# Patient Record
Sex: Male | Born: 1970 | Race: Asian | Hispanic: Yes | Marital: Married | Smoking: Never smoker
Health system: Southern US, Community
[De-identification: ages and names within clinical notes are randomized; demographics above are authoritative.]

## PROBLEM LIST (undated history)

## (undated) DIAGNOSIS — E785 Hyperlipidemia, unspecified: Secondary | ICD-10-CM

---

## 2016-01-17 ENCOUNTER — Emergency Department (HOSPITAL_COMMUNITY): Payer: No Typology Code available for payment source

## 2016-01-17 ENCOUNTER — Emergency Department (HOSPITAL_COMMUNITY)
Admission: EM | Admit: 2016-01-17 | Discharge: 2016-01-17 | Disposition: A | Discharge status: Home / Self Care | Payer: No Typology Code available for payment source | Attending: Emergency Medicine | Admitting: Emergency Medicine

## 2016-01-17 ENCOUNTER — Encounter (HOSPITAL_COMMUNITY): Payer: Self-pay | Admitting: Emergency Medicine

## 2016-01-17 DIAGNOSIS — R9431 Abnormal electrocardiogram [ECG] [EKG]: Secondary | ICD-10-CM | POA: Diagnosis not present

## 2016-01-17 DIAGNOSIS — R079 Chest pain, unspecified: Secondary | ICD-10-CM | POA: Diagnosis not present

## 2016-01-17 DIAGNOSIS — R0789 Other chest pain: Secondary | ICD-10-CM | POA: Diagnosis present

## 2016-01-17 DIAGNOSIS — K7689 Other specified diseases of liver: Secondary | ICD-10-CM

## 2016-01-17 HISTORY — DX: Hyperlipidemia, unspecified: E78.5

## 2016-01-17 LAB — BASIC METABOLIC PANEL
ANION GAP: 7 (ref 5–15)
BUN: 15 mg/dL (ref 6–20)
CO2: 26 mmol/L (ref 22–32)
Calcium: 9.2 mg/dL (ref 8.9–10.3)
Chloride: 105 mmol/L (ref 101–111)
Creatinine, Ser: 1.03 mg/dL (ref 0.61–1.24)
GFR calc Af Amer: >60 mL/min (ref >60)
GLUCOSE: 112 mg/dL — AB (ref 65–99)
Potassium: 4 mmol/L (ref 3.5–5.1)
SODIUM: 138 mmol/L (ref 135–145)

## 2016-01-17 LAB — CBC
HCT: 45.1 % (ref 39.0–52.0)
HEMOGLOBIN: 16 g/dL (ref 13.0–17.0)
MCH: 32.4 pg (ref 26.0–34.0)
MCHC: 35.5 g/dL (ref 30.0–36.0)
MCV: 91.3 fL (ref 78.0–100.0)
Platelets: 172 10*3/uL (ref 150–400)
RBC: 4.94 MIL/uL (ref 4.22–5.81)
RDW: 12.4 % (ref 11.5–15.5)
WBC: 5.6 10*3/uL (ref 4.0–10.5)

## 2016-01-17 LAB — I-STAT TROPONIN, ED
TROPONIN I, POC: 0 ng/mL (ref 0.00–0.08)
TROPONIN I, POC: 0 ng/mL (ref 0.00–0.08)

## 2016-01-17 LAB — HEPATIC FUNCTION PANEL
ALT: 19 U/L (ref 17–63)
AST: 22 U/L (ref 15–41)
Albumin: 4 g/dL (ref 3.5–5.0)
Alkaline Phosphatase: 64 U/L (ref 38–126)
BILIRUBIN DIRECT: 0.1 mg/dL (ref 0.1–0.5)
BILIRUBIN INDIRECT: 1 mg/dL — AB (ref 0.3–0.9)
TOTAL PROTEIN: 6.4 g/dL — AB (ref 6.5–8.1)
Total Bilirubin: 1.1 mg/dL (ref 0.3–1.2)

## 2016-01-17 LAB — SEDIMENTATION RATE: Sed Rate: 1 mm/hr (ref 0–16)

## 2016-01-17 LAB — LIPASE, BLOOD: Lipase: 24 U/L (ref 11–51)

## 2016-01-17 MED ORDER — COLCHICINE 0.6 MG PO TABS: 0.6000 mg | ORAL_TABLET | Freq: Two times a day (BID) | ORAL | 0 refills | Status: DC

## 2016-01-17 MED ORDER — NITROGLYCERIN 0.4 MG SL SUBL
0.4000 mg | SUBLINGUAL_TABLET | SUBLINGUAL | Status: DC | PRN
  Administered 2016-01-17: 0.4 mg via SUBLINGUAL
  Filled 2016-01-17: qty 1

## 2016-01-17 MED ORDER — IBUPROFEN 800 MG PO TABS: 800.0000 mg | ORAL_TABLET | Freq: Three times a day (TID) | ORAL | 0 refills | Status: AC

## 2016-01-17 MED ORDER — ASPIRIN 81 MG PO CHEW
324.0000 mg | CHEWABLE_TABLET | Freq: Once | ORAL | Status: AC
  Administered 2016-01-17: 324 mg via ORAL
  Filled 2016-01-17: qty 4

## 2016-01-17 MED ORDER — IOPAMIDOL (ISOVUE-370) INJECTION 76%
INTRAVENOUS | Status: AC
  Administered 2016-01-17: 80 mL
  Filled 2016-01-17: qty 100

## 2016-01-17 MED ORDER — PANTOPRAZOLE SODIUM 40 MG PO TBEC
40.0000 mg | DELAYED_RELEASE_TABLET | Freq: Once | ORAL | Status: AC
  Administered 2016-01-17: 40 mg via ORAL
  Filled 2016-01-17: qty 1

## 2016-01-17 NOTE — ED Notes (Signed)
Patient transported to X-ray 

## 2016-01-17 NOTE — ED Provider Notes (Signed)
[facility]EMERGENCY DEPT Provider Note   CSN: [id number] Arrival date & time: 01/17/16  [id number]     History   Chief Complaint Chief Complaint  Patient presents with  . Chest Pain    HPI Luis Simmons is a 45 y.o. male.  HPI Patient was awakened from sleep by chest pain approximately 45 minutes prior to arrival. He reports it was like a heavy and painful pressure in the center of his chest. He states that the time he also had a little bit of a sweat and nausea. He denies shortness of breath. Pain resolved just shortly after arriving to the emergency department. He reports at this time his pain is gone. He did not take anything to try to treat it. He denies history of similar pain. Patient denies pain, fatigue or dyspnea with usual activities. No recent long travel or immobilization. Patient does not smoke. No cardiac history. He reports borderline hypercholesterolemia.  Family history: Patient's father had bypass surgery at age 46. History reviewed. No pertinent past medical history.  There are no active problems to display for this patient.   History reviewed. No pertinent surgical history.     Home Medications    Prior to Admission medications   Not on File    Family History History reviewed. No pertinent family history.  Social History Social History  Substance Use Topics  . Smoking status: Never Smoker  . Smokeless tobacco: Never Used  . Alcohol use No     Allergies   Review of patient's allergies indicates no known allergies.   Review of Systems Review of Systems  10 Systems reviewed and are negative for acute change except as noted in the HPI.  Physical Exam Updated Vital Signs BP 115/76   Pulse 61   Temp 97.7 F (36.5 C) (Oral)   Resp 16   SpO2 98%   Physical Exam  Constitutional: He appears well-developed and well-nourished.  HENT:  Head: Normocephalic and atraumatic.  Nose: Nose normal.  Mouth/Throat: Oropharynx is clear and moist.  Eyes:  Conjunctivae and EOM are normal. Pupils are equal, round, and reactive to light.  Neck: Neck supple.  Cardiovascular: Normal rate and regular rhythm.   No murmur heard. Pulmonary/Chest: Effort normal and breath sounds normal. No respiratory distress. He exhibits no tenderness.  Abdominal: Soft. There is no tenderness.  Musculoskeletal: He exhibits no edema, tenderness or deformity.  Neurological: He is alert.  Skin: Skin is warm and dry.  Psychiatric: He has a normal mood and affect.  Nursing note and vitals reviewed.    ED Treatments / Results  Labs (all labs ordered are listed, but only abnormal results are displayed) Labs Reviewed  BASIC METABOLIC PANEL - Abnormal; Notable for the following:       Result Value   Glucose, Bld 112 (*)    All other components within normal limits  HEPATIC FUNCTION PANEL - Abnormal; Notable for the following:    Total Protein 6.4 (*)    Indirect Bilirubin 1.0 (*)    All other components within normal limits  CBC  LIPASE, BLOOD  I-STAT TROPOININ, ED    EKG  EKG Interpretation  Date/Time:  Friday January 17 2016 05:33:17 EDT Ventricular Rate:  55 PR Interval:    QRS Duration: 78 QT Interval:  428 QTC Calculation: 410 R Axis:   60 Text Interpretation:  Sinus rhythm RSR' in V1 or V2, probably normal variant ST elevation suggests acute pericarditis agree. no old comparison. Confirmed by Donnald GarrePfeiffer, MD, Lebron ConnersMarcy (  [id number]) on 01/17/2016 5:39:43 AM Also confirmed by Donnald Garre, MD, Lebron Conners [phone]), editor [place]  CCT, [place] ([id number])  on 01/17/2016 6:59:54 AM       Radiology Dg Chest 2 View  Result Date: 01/17/2016 CLINICAL DATA:  Centralized chest pain this morning, now improving. EXAM: CHEST  2 VIEW COMPARISON:  None. FINDINGS: Cardiomediastinal silhouette is normal. No pleural effusions or focal consolidations. Trachea projects midline and there is no pneumothorax. Soft tissue planes and included osseous structures are non-suspicious. IMPRESSION:  Normal chest. Electronically Signed   By: Awilda Metro M.D.   On: 01/17/2016 06:24    Procedures Procedures (including critical care time)  Medications Ordered in ED Medications  aspirin chewable tablet 324 mg (324 mg Oral Given 01/17/16 0551)  pantoprazole (PROTONIX) EC tablet 40 mg (40 mg Oral Given 01/17/16 0608)     Initial Impression / Assessment and Plan / ED Course  I have reviewed the triage vital signs and the nursing notes.  Pertinent labs & imaging results that were available during my care of the patient were reviewed by me and considered in my medical decision making (see chart for details).  Clinical Course   Consult: Dr. Rennis Golden cardiology will consult in ER.  Final Clinical Impressions(s) / ED Diagnoses   Final diagnoses:  Chest pain, unspecified chest pain type   Definitive plan per cardiology consult. New Prescriptions New Prescriptions   No medications on file     Arby Barrette, MD 01/26/16 [id number]

## 2016-01-17 NOTE — Consult Note (Signed)
CONSULTATION NOTE  Reason for Consult: Chest pain  Requesting Physician: Dr. Johnney Killian  Cardiologist: None (NEW)  HPI: This is a 45 y.o. male with no significant past medical history, who presents with chest pain that awakened him from sleep approximate 45 minutes prior to arrival to the hospital. He reported heaviness and central pressure in his chest. He was nauseated and somewhat diaphoretic. He reports the pain resolved just after arriving to the emergency department. Family history significant for coronary artery disease in his father who had bypass surgery at age 11 however he has no significant personal risk factors. He denies tobacco use, hypertension, diabetes and has had borderline diet-controlled dyslipidemia in the past. He says the pain is somewhat worse while laying down and a little bit improved when sitting up but denies any fevers or chills and has a normal white blood cell count. He has not had any recent sick contacts nor any recent viral illness.  PMHx:  Past Medical History:  Diagnosis Date  . Diet-controlled hyperlipidemia    History reviewed. No pertinent surgical history.  FAMHx: Family History  Problem Relation Age of Onset  . CAD Father     Bypass at age 38    SOCHx:  reports that he has never smoked. He has never used smokeless tobacco. He reports that he does not drink alcohol. His drug history is not on file.  ALLERGIES: No Known Allergies  ROS: Pertinent items noted in HPI and remainder of comprehensive ROS otherwise negative.  HOME MEDICATIONS: No current facility-administered medications on file prior to encounter.    No current outpatient prescriptions on file prior to encounter.    HOSPITAL MEDICATIONS: I have reviewed the patient's current medications.  VITALS: Blood pressure 115/76, pulse 61, temperature 97.7 F (36.5 C), temperature source Oral, resp. rate 16, SpO2 98 %.  PHYSICAL EXAM: General appearance: alert and no  distress Neck: no carotid bruit and no JVD Lungs: clear to auscultation bilaterally Heart: regular rate and rhythm Abdomen: soft, non-tender; bowel sounds normal; no masses,  no organomegaly Extremities: extremities normal, atraumatic, no cyanosis or edema Pulses: 2+ and symmetric Skin: Skin color, texture, turgor normal. No rashes or lesions Neurologic: Grossly normal Psych: Pleasant  LABS: Results for orders placed or performed during the hospital encounter of 01/17/16 (from the past 48 hour(s))  Basic metabolic panel     Status: Abnormal   Collection Time: 01/17/16  5:45 AM  Result Value Ref Range   Sodium 138 135 - 145 mmol/L   Potassium 4.0 3.5 - 5.1 mmol/L   Chloride 105 101 - 111 mmol/L   CO2 26 22 - 32 mmol/L   Glucose, Bld 112 (H) 65 - 99 mg/dL   BUN 15 6 - 20 mg/dL   Creatinine, Ser 1.03 0.61 - 1.24 mg/dL   Calcium 9.2 8.9 - 10.3 mg/dL   GFR calc non Af Amer >60 >60 mL/min   GFR calc Af Amer >60 >60 mL/min    Comment: (NOTE) The eGFR has been calculated using the CKD EPI equation. This calculation has not been validated in all clinical situations. eGFR's persistently <60 mL/min signify possible Chronic Kidney Disease.    Anion gap 7 5 - 15  CBC     Status: None   Collection Time: 01/17/16  5:45 AM  Result Value Ref Range   WBC 5.6 4.0 - 10.5 K/uL   RBC 4.94 4.22 - 5.81 MIL/uL   Hemoglobin 16.0 13.0 - 17.0 g/dL   HCT  45.1 39.0 - 52.0 %   MCV 91.3 78.0 - 100.0 fL   MCH 32.4 26.0 - 34.0 pg   MCHC 35.5 30.0 - 36.0 g/dL   RDW 12.4 11.5 - 15.5 %   Platelets 172 150 - 400 K/uL  Hepatic function panel     Status: Abnormal   Collection Time: 01/17/16  5:45 AM  Result Value Ref Range   Total Protein 6.4 (L) 6.5 - 8.1 g/dL   Albumin 4.0 3.5 - 5.0 g/dL   AST 22 15 - 41 U/L   ALT 19 17 - 63 U/L   Alkaline Phosphatase 64 38 - 126 U/L   Total Bilirubin 1.1 0.3 - 1.2 mg/dL   Bilirubin, Direct 0.1 0.1 - 0.5 mg/dL   Indirect Bilirubin 1.0 (H) 0.3 - 0.9 mg/dL  Lipase,  blood     Status: None   Collection Time: 01/17/16  5:45 AM  Result Value Ref Range   Lipase 24 11 - 51 U/L  I-stat troponin, ED     Status: None   Collection Time: 01/17/16  5:49 AM  Result Value Ref Range   Troponin i, poc 0.00 0.00 - 0.08 ng/mL   Comment 3            Comment: Due to the release kinetics of cTnI, a negative result within the first hours of the onset of symptoms does not rule out myocardial infarction with certainty. If myocardial infarction is still suspected, repeat the test at appropriate intervals.     IMAGING: Dg Chest 2 View  Result Date: 01/17/2016 CLINICAL DATA:  Centralized chest pain this morning, now improving. EXAM: CHEST  2 VIEW COMPARISON:  None. FINDINGS: Cardiomediastinal silhouette is normal. No pleural effusions or focal consolidations. Trachea projects midline and there is no pneumothorax. Soft tissue planes and included osseous structures are non-suspicious. IMPRESSION: Normal chest. Electronically Signed   By: Elon Alas M.D.   On: 01/17/2016 06:24   EKG: Normal sinus rhythm with less than 1 mm ST elevation at the J point diffusely in the inferior and anterolateral leads, consider pericarditis or early repolarization.  HOSPITAL DIAGNOSES: Active Problems:   Chest pain, unspecified   Abnormal finding on EKG   IMPRESSION: 1. Acute precordial chest pain, possibly consistent with unstable angina or pericarditis  RECOMMENDATION: 1. Mr. Agard presents with acute onset chest pain that woke him from sleep. He reported it as a central heaviness associated with some nausea and diaphoresis. The symptoms lasted for about 30-45 minutes and then resolve spontaneously. He does not have a history of reflux or any chest pain similar to this. His family history is compelling for coronary disease but he has few personal risk factors. Laboratory work is unremarkable. EKG is concerning for either pericarditis or early repolarization. He does have some  positional change in his symptoms however does not have an elevated white blood cell count, fever or recent viral illness which could suggest pericarditis. I would recommend a second troponin. If this number is negative, then would proceed with a CT coronary angiogram to determine if there is any significant obstructive coronary disease and evaluate his pericardium. Will also add an ESR to his labs to hopefully rule out a pericarditis. Please keep NPO - if his CT angiogram is abnormal, we will admit him and he will likely need LHC later today.  Thanks for the consultation.  Time Spent Directly with Patient: 45 minutes  Pixie Casino, MD, [facility] Attending Cardiologist [facility]  Chrissie Noa  C Jadaya Sommerfield 01/17/2016, 8:23 AM

## 2016-01-17 NOTE — ED Notes (Signed)
Pt is dressed and ready for discharge at this time.

## 2016-01-17 NOTE — ED Triage Notes (Signed)
Pt presents from home with RIGHT sided CP that began approx 45 min PTA which awoke pt from sleep; pt states the pain is heavy and intermittent; denies hx of cardiac illness; pt also reports cold chills PTA;

## 2016-01-17 NOTE — ED Provider Notes (Addendum)
Care assumed from Dr. Donnald GarrePfeiffer at 0730 with plan for f/u cardiology consultation.   Results:  BP 115/76   Pulse 61   Temp 97.7 F (36.5 C) (Oral)   Resp 16   SpO2 98%   Results for orders placed or performed during the hospital encounter of 01/17/16  Basic metabolic panel  Result Value Ref Range   Sodium 138 135 - 145 mmol/L   Potassium 4.0 3.5 - 5.1 mmol/L   Chloride 105 101 - 111 mmol/L   CO2 26 22 - 32 mmol/L   Glucose, Bld 112 (H) 65 - 99 mg/dL   BUN 15 6 - 20 mg/dL   Creatinine, Ser 1.611.03 0.61 - 1.24 mg/dL   Calcium 9.2 8.9 - 09.610.3 mg/dL   GFR calc non Af Amer >60 >60 mL/min   GFR calc Af Amer >60 >60 mL/min   Anion gap 7 5 - 15  CBC  Result Value Ref Range   WBC 5.6 4.0 - 10.5 K/uL   RBC 4.94 4.22 - 5.81 MIL/uL   Hemoglobin 16.0 13.0 - 17.0 g/dL   HCT 04.545.1 40.939.0 - 81.152.0 %   MCV 91.3 78.0 - 100.0 fL   MCH 32.4 26.0 - 34.0 pg   MCHC 35.5 30.0 - 36.0 g/dL   RDW 91.412.4 78.211.5 - 95.615.5 %   Platelets 172 150 - 400 K/uL  Hepatic function panel  Result Value Ref Range   Total Protein 6.4 (L) 6.5 - 8.1 g/dL   Albumin 4.0 3.5 - 5.0 g/dL   AST 22 15 - 41 U/L   ALT 19 17 - 63 U/L   Alkaline Phosphatase 64 38 - 126 U/L   Total Bilirubin 1.1 0.3 - 1.2 mg/dL   Bilirubin, Direct 0.1 0.1 - 0.5 mg/dL   Indirect Bilirubin 1.0 (H) 0.3 - 0.9 mg/dL  Lipase, blood  Result Value Ref Range   Lipase 24 11 - 51 U/L  Sedimentation rate  Result Value Ref Range   Sed Rate 1 0 - 16 mm/hr  I-stat troponin, ED  Result Value Ref Range   Troponin i, poc 0.00 0.00 - 0.08 ng/mL   Comment 3          [facility] T[facility]onin[facility])  Result Value Ref Range   Troponin i, poc 0.00 0.00 - 0.08 ng/mL   Comment 3            Dg Chest 2 View  Result Date: 01/17/2016 CLINICAL DATA:  Centralized chest pain this morning, now improving. EXAM: CHEST  2 VIEW COMPARISON:  None. FINDINGS: Cardiomediastinal silhouette is normal. No pleural effusions or focal consolidations. Trachea projects midline and there is  no pneumothorax. Soft tissue planes and included osseous structures are non-suspicious. IMPRESSION: Normal chest. Electronically Signed   By: Awilda Metroourtnay  Bloomer M.D.   On: 01/17/2016 06:24   Ct Coronary Morph W/cta Cor W/score W/ca W/cm &/or Wo/cm  Addendum Date: 01/17/2016   ADDENDUM REPORT: 01/17/2016 10:43 CLINICAL DATA:  45 year old male who presented to the ER with an acute chest pain. EXAM: Cardiac/Coronary  CT TECHNIQUE: The patient was scanned on a Philips 256 scanner. FINDINGS: A 120 kV prospective scan was triggered in the descending thoracic aorta at 111 HU's. Axial non-contrast 3 mm slices were carried out through the heart. The data set was analyzed on a dedicated work station and scored using the Agatson method. Gantry rotation speed was 270 msecs and collimation was .9 mm. No beta blockade and 0.4 mg of  sl NTG was given. The 3D data set was reconstructed in 5% intervals of the 67-82 % of the R-R cycle. Diastolic phases were analyzed on a dedicated work station using MPR, MIP and VRT modes. The patient received 80 cc of contrast. Aorta:  Normal size.  No calcifications.  No dissection. Aortic Valve:  Trileaflet.  No calcifications. Coronary Arteries:  Normal coronary origin.  Right dominance. Left main is a large artery with no plaque. LAD is a large artery that wraps around the apex and gives rise to one diagonal branch. There is no plaque. LCX artery is a medium size non-dominant vessel that gives rise to one OM branch. There is no plaque. RCA is a large dominant artery that gives rise to PDA and PLVB, there is no plaque. IMPRESSION: 1. Coronary calcium score of 0. This was 0 percentile for age and sex matched control. 2. Normal coronary origin.  Right dominance. 3. The study is affected by misregistration artifact, however there is no evidence of CAD. 4. There is trivial amount of pericardial effusion posterior to the heart. Pericardium is not thickened. Tobias Alexander Electronically Signed    By: Tobias Alexander   On: 01/17/2016 10:43   Result Date: 01/17/2016 EXAM: OVER-READ INTERPRETATION  CT CHEST The following report is an over-read performed by radiologist Dr. Nadara Eaton [facility] on 01/17/2016. This over-read does not include interpretation of cardiac or coronary anatomy or pathology. The coronary calcium score/coronary CTA interpretation by the cardiologist is attached. COMPARISON:  Chest radiographs 01/17/2016 FINDINGS: 6 mm calcified pleural-based nodule in the left lower lobe superior segment. Few densities along the dependent aspect of the right lower lobe are probably related to atelectasis. Normal caliber of the visualized thoracic aorta without dissection. There is an enhancing lesion in the anterior left hepatic lobe measuring up to 4.3 cm. No significant pericardial or pleural fluid. Limited evaluation of the central pulmonary arteries due to motion artifact and poor contrast opacification. No significant chest lymphadenopathy. No acute bone abnormality. IMPRESSION: 4.3 cm enhancing lesion in the left hepatic lobe. Lesion is nonspecific on this arterial phase of imaging. Recommend more definitive characterization of this liver lesion with a liver MRI, with and without contrast. These results will be called to the ordering clinician or representative by the Radiologist Assistant, and communication documented in the PACS or zVision Dashboard. Electronically Signed: By: Richarda Overlie M.D. On: 01/17/2016 10:20    Radiology and laboratory examinations were reviewed by me and used in medical decision making if performed.   MDM:  Cardiology recommending repeat troponin and CT coronary angiography for further risk stratification.  Incidental lever lesion noted and will require outpatient f/u with non-emergent MRI which was communicated to patient.   CT coronaries is low risk with calcium score 0. Cardiology has cleared for dc. Appreciate input from Dr Rennis Golden. Pt to be  treated empirically for pericarditis and will f/u with cardiology as instructed.  Plan to follow up with PCP as needed for recheck/imaging and return precautions discussed for worsening or new concerning symptoms.   Diagnoses that have been ruled out:  None  Diagnoses that are still under consideration:  None  Final diagnoses:  Chest pain, unspecified chest pain type  Chest pain  Liver nodule      Lyndal Pulley, MD 01/17/16 1055    Lyndal Pulley, MD 01/17/16 1114

## 2016-01-17 NOTE — Progress Notes (Signed)
Office scheduling line busy. Dr. Rennis GoldenHilty requests 1 month follow-up with him. [facility] requesting a follow-up appointment, and our office will call the patient with this information. Of note he also spoke with the patient about his liver abnormality on MRI and also recommended f/u with PCP for this.  Dayna Dunn PA-C

## 2016-01-17 NOTE — ED Notes (Signed)
Pt returned from CT scan-- coke given-- pt denies any pain.

## 2016-01-17 NOTE — Discharge Instructions (Addendum)
YOU HAVE AN INCIDENTAL FINDING OF A NODULE ON YOUR LIVER WHICH WILL REQUIRE FOLLOW UP WITH AN MRI SCAN NON-EMERGENTLY WITH YOUR PRIMARY CARE PHYSICIAN.

## 2016-01-17 NOTE — Progress Notes (Signed)
Reviewed CT angiogram, there is no evidence for significant CAD. Coronary calcium score is zero. Trivial posterior pericardial effusion is noted without pericardial thickening. ESR is 1. Mild diffuse ST elevation could be c/w pericarditis. Recommend treatement with ibuprofen 800 mg TID for 2 weeks and colchicine 0.6 mg BID x 1-3 months. Incidental "enhancing lesion" noted in the liver - could be hemangioma. Dedicated liver MRI recommended - advised patient to follow-up with PCP for evaluation of this. Follow-up with me in the [place] office in 1 month.  Pixie Casino, MD, [facility] Attending Cardiologist [place]

## 2016-03-10 ENCOUNTER — Ambulatory Visit (INDEPENDENT_AMBULATORY_CARE_PROVIDER_SITE_OTHER): Payer: No Typology Code available for payment source | Admitting: Internal Medicine

## 2016-03-10 ENCOUNTER — Encounter: Payer: Self-pay | Admitting: Internal Medicine

## 2016-03-10 VITALS — BP 100/70 | HR 73 | Ht 69.0 in | Wt 171.4 lb

## 2016-03-10 DIAGNOSIS — I300 Acute nonspecific idiopathic pericarditis: Secondary | ICD-10-CM | POA: Diagnosis not present

## 2016-03-10 NOTE — Progress Notes (Signed)
    OFFICE NOTE  Chief Complaint:  No complaints  Primary Care Physician: Luis AstHOLMES, DIONNE NATALIE, MD  HPI:  Luis Simmons is a 45 y.o. male who presents from Hospital follow-up. He was seen in emergency department on 01/17/2016 for chest pain and woken him from sleep. It was described as heaviness and central pressure in the chest with some nausea and diaphoresis. Family history significant for father who had bypass surgery at age 46. He underwent a CT angiogram which showed no evidence of significant coronary disease and 0 coronary artery calcium score. There is a trivial posterior pericardial effusion without pericardial thickening. Erythrocyte sedimentation rate was 1. There were some mild ST segment changes consistent with possible pericarditis seen in multiple leads. I recommended treatment with ibuprofen and colchicine. He completed that medication and notes that he's had no further chest pain symptoms. There was an incidental "enhancing lesion" noted in the liver which could be a hemangioma. 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  PMHx:  Past Medical History:  Diagnosis Date  . Diet-controlled hyperlipidemia     No past surgical history on file.  FAMHx:  Family History  Problem Relation Age of Onset  . CAD Father     Bypass at age 46    SOCHx:   reports that he has never smoked. He has never used smokeless tobacco. He reports that he does not drink alcohol. His drug history is not on file.  ALLERGIES:  No Known Allergies  ROS: Pertinent items noted in HPI and remainder of comprehensive ROS otherwise negative.  HOME MEDS: Current Outpatient Prescriptions on File Prior to Visit  Medication Sig Dispense Refill  . colchicine 0.6 MG tablet Take 1 tablet (0.6 mg total) by mouth 2 (two) times daily. 20 tablet 0   No current facility-administered medications on file prior to visit.     LABS/IMAGING: No  results found for this or any previous visit (from the past 48 hour(s)). No results found.  WEIGHTS: Wt Readings from Last 3 Encounters:  03/10/16 171 lb 6.4 oz (77.7 kg)    VITALS: BP 100/70   Pulse 73   Ht 5\' 9"  (1.753 m)   Wt 171 lb 6.4 oz (77.7 kg)   BMI 25.31 kg/m   EXAM: Deferred  EKG: Deferred  ASSESSMENT: 1. Chest pain-possibly pericarditis, resolved 2. Incidental hemangioma in the liver  PLAN: 1.   [name] had an episode of chest pain which could've possibly been pericarditis with some diffuse ST changes. He completed ibuprofen and colchicine with resolution of his chest pain. Coronary artery CT scan demonstrated no significant coronary disease and no coronary artery calcification. He needs traditional risk factor modification given his family history of coronary disease but follow-up with me can be on an as-needed basis. He should follow-up with his primary care provider.  Chrystie NoseKenneth C. Hilty, MD, [facility] Attending Cardiologist [facility]  Chrystie NoseKenneth C Hilty 03/10/2016, 2:19 PM

## 2016-03-10 NOTE — Patient Instructions (Signed)
Medication Instructions:  Your physician recommends that you continue on your current medications as directed. Please refer to the Current Medication list given to you today.  Labwork: NONE  Testing/Procedures: NONE  Follow-Up: FOLLOW UP AS NEEDED  Any Other Special Instructions Will Be Listed Below (If Applicable).     If you need a refill on your cardiac medications before your next appointment, please call your pharmacy.

## 2017-07-14 IMAGING — CR DG CHEST 2V
2 series · 2 of 2 positions shown · non-contrast
Comparison: None.

CLINICAL DATA: Centralized chest pain this morning, now improving.

EXAM:
CHEST  2 VIEW

[chest pa]
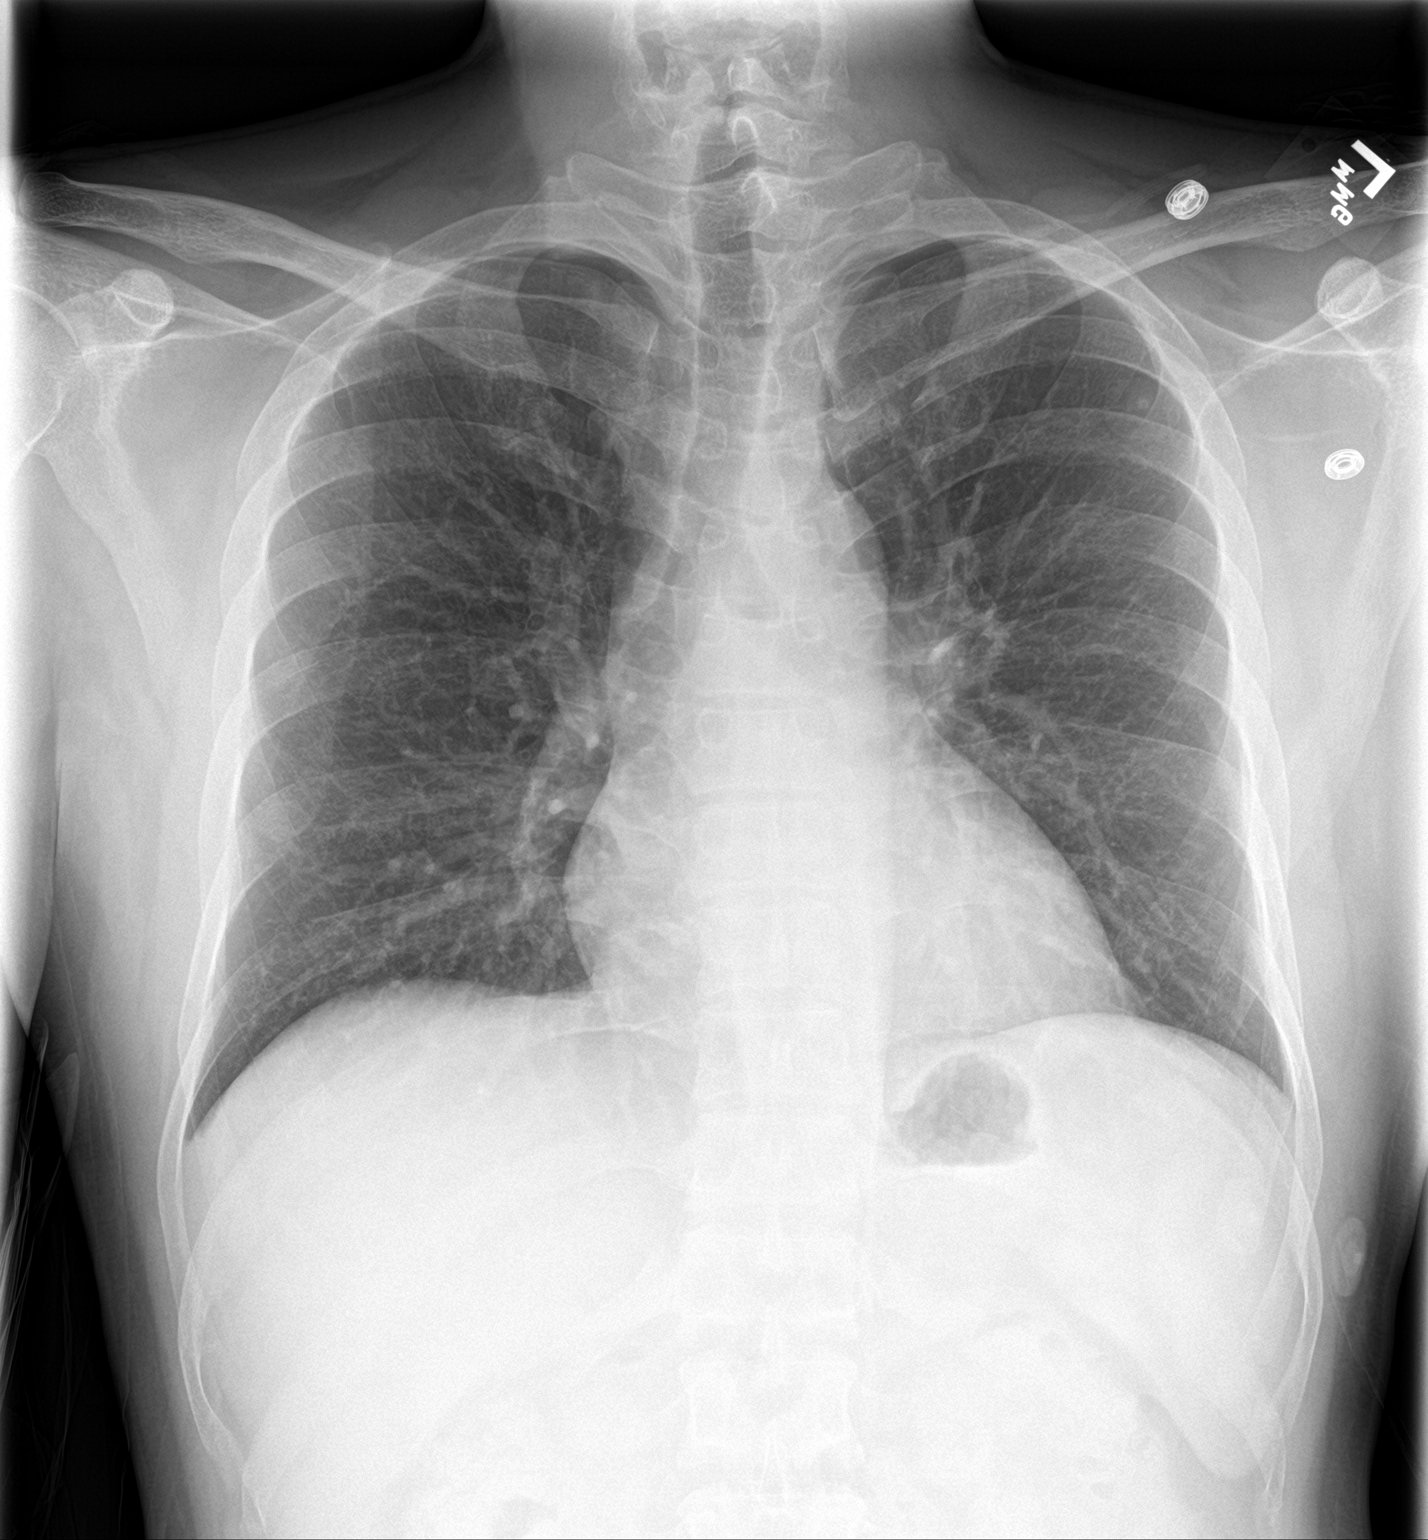

[chest lat]
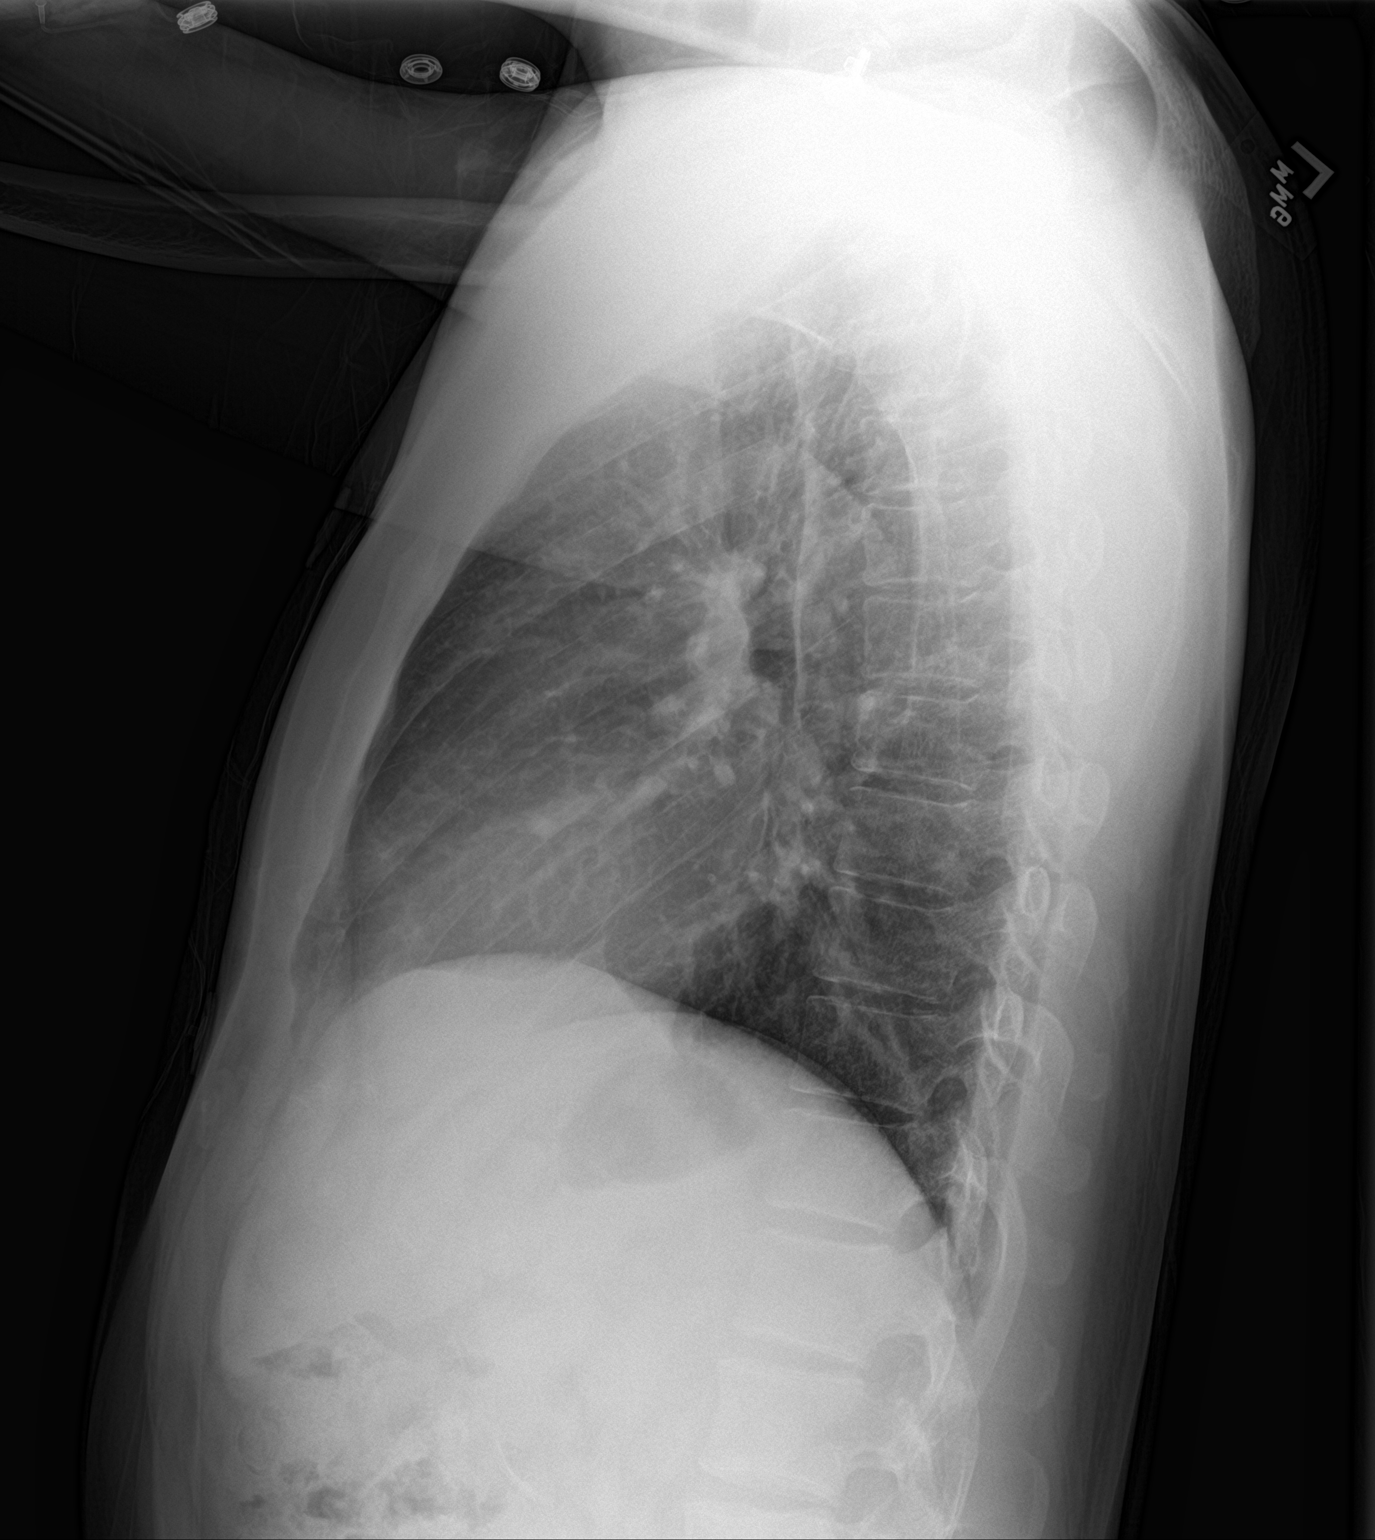

[2 of 2 positions shown; findings below may reference images not displayed]

FINDINGS: Cardiomediastinal silhouette is normal. No pleural effusions or
focal consolidations. Trachea projects midline and there is no
pneumothorax. Soft tissue planes and included osseous structures are
non-suspicious.
IMPRESSION: Normal chest.

## 2020-09-17 ENCOUNTER — Encounter (HOSPITAL_COMMUNITY): Payer: Self-pay | Admitting: Emergency Medicine

## 2020-09-17 ENCOUNTER — Observation Stay (HOSPITAL_COMMUNITY)
Admission: EM | Admit: 2020-09-17 | Discharge: 2020-09-18 | Disposition: A | Discharge status: Home / Self Care | Payer: PRIVATE HEALTH INSURANCE | Attending: Surgery | Admitting: Surgery

## 2020-09-17 ENCOUNTER — Emergency Department (HOSPITAL_COMMUNITY): Payer: PRIVATE HEALTH INSURANCE

## 2020-09-17 ENCOUNTER — Encounter (HOSPITAL_COMMUNITY)
Admission: EM | Disposition: A | Discharge status: Home / Self Care | Payer: Self-pay | Source: Home / Self Care | Attending: Emergency Medicine

## 2020-09-17 ENCOUNTER — Observation Stay (HOSPITAL_COMMUNITY): Payer: PRIVATE HEALTH INSURANCE | Admitting: Anesthesiology

## 2020-09-17 ENCOUNTER — Other Ambulatory Visit: Payer: Self-pay

## 2020-09-17 DIAGNOSIS — R1011 Right upper quadrant pain: Secondary | ICD-10-CM | POA: Diagnosis present

## 2020-09-17 DIAGNOSIS — U071 COVID-19: Secondary | ICD-10-CM | POA: Insufficient documentation

## 2020-09-17 DIAGNOSIS — K8000 Calculus of gallbladder with acute cholecystitis without obstruction: Principal | ICD-10-CM | POA: Insufficient documentation

## 2020-09-17 DIAGNOSIS — K810 Acute cholecystitis: Secondary | ICD-10-CM

## 2020-09-17 DIAGNOSIS — Z79899 Other long term (current) drug therapy: Secondary | ICD-10-CM | POA: Diagnosis not present

## 2020-09-17 HISTORY — PX: CHOLECYSTECTOMY: SHX55

## 2020-09-17 LAB — CBC
HCT: 49.6 % (ref 39.0–52.0)
Hemoglobin: 17.9 g/dL — ABNORMAL HIGH (ref 13.0–17.0)
MCH: 32 pg (ref 26.0–34.0)
MCHC: 36.1 g/dL — ABNORMAL HIGH (ref 30.0–36.0)
MCV: 88.6 fL (ref 80.0–100.0)
Platelets: 234 10*3/uL (ref 150–400)
RBC: 5.6 MIL/uL (ref 4.22–5.81)
RDW: 11.8 % (ref 11.5–15.5)
WBC: 14.4 10*3/uL — ABNORMAL HIGH (ref 4.0–10.5)
nRBC: 0 % (ref 0.0–0.2)

## 2020-09-17 LAB — COMPREHENSIVE METABOLIC PANEL
ALT: 20 U/L (ref 0–44)
AST: 25 U/L (ref 15–41)
Albumin: 4 g/dL (ref 3.5–5.0)
Alkaline Phosphatase: 61 U/L (ref 38–126)
Anion gap: 12 (ref 5–15)
BUN: 16 mg/dL (ref 6–20)
CO2: 25 mmol/L (ref 22–32)
Calcium: 9.6 mg/dL (ref 8.9–10.3)
Chloride: 96 mmol/L — ABNORMAL LOW (ref 98–111)
Creatinine, Ser: 0.93 mg/dL (ref 0.61–1.24)
GFR, Estimated: >60 mL/min (ref >60)
Glucose, Bld: 148 mg/dL — ABNORMAL HIGH (ref 70–99)
Potassium: 3.8 mmol/L (ref 3.5–5.1)
Sodium: 133 mmol/L — ABNORMAL LOW (ref 135–145)
Total Bilirubin: 1.3 mg/dL — ABNORMAL HIGH (ref 0.3–1.2)
Total Protein: 7.1 g/dL (ref 6.5–8.1)

## 2020-09-17 LAB — LIPASE, BLOOD: Lipase: 47 U/L (ref 11–51)

## 2020-09-17 LAB — URINALYSIS, ROUTINE W REFLEX MICROSCOPIC
Bacteria, UA: NONE SEEN
Bilirubin Urine: NEGATIVE
Glucose, UA: NEGATIVE mg/dL
Ketones, ur: 5 mg/dL — AB
Leukocytes,Ua: NEGATIVE
Nitrite: NEGATIVE
Protein, ur: NEGATIVE mg/dL
Specific Gravity, Urine: 1.028 (ref 1.005–1.030)
pH: 5 (ref 5.0–8.0)

## 2020-09-17 LAB — SARS CORONAVIRUS 2 BY RT PCR (HOSPITAL ORDER, PERFORMED IN ~~LOC~~ HOSPITAL LAB): SARS Coronavirus 2: POSITIVE — AB

## 2020-09-17 SURGERY — LAPAROSCOPIC CHOLECYSTECTOMY WITH INTRAOPERATIVE CHOLANGIOGRAM
Anesthesia: General

## 2020-09-17 MED ORDER — FENTANYL CITRATE (PF) 250 MCG/5ML IJ SOLN
INTRAMUSCULAR | Status: AC
  Filled 2020-09-17: qty 5

## 2020-09-17 MED ORDER — SODIUM CHLORIDE 0.9 % IR SOLN
Status: DC | PRN
  Administered 2020-09-17: 1000 mL

## 2020-09-17 MED ORDER — ACETAMINOPHEN 500 MG PO TABS
1000.0000 mg | ORAL_TABLET | Freq: Four times a day (QID) | ORAL | Status: DC
  Administered 2020-09-17: 1000 mg via ORAL
  Filled 2020-09-17: qty 2

## 2020-09-17 MED ORDER — HYDROMORPHONE HCL 1 MG/ML IJ SOLN
1.0000 mg | Freq: Once | INTRAMUSCULAR | Status: AC
  Administered 2020-09-17: 1 mg via INTRAVENOUS
  Filled 2020-09-17: qty 1

## 2020-09-17 MED ORDER — DOCUSATE SODIUM 100 MG PO CAPS
100.0000 mg | ORAL_CAPSULE | Freq: Two times a day (BID) | ORAL | Status: DC
  Administered 2020-09-17 – 2020-09-18 (×3): 100 mg via ORAL
  Filled 2020-09-17 (×4): qty 1

## 2020-09-17 MED ORDER — MORPHINE SULFATE (PF) 2 MG/ML IV SOLN: 2.0000 mg | INTRAVENOUS | Status: DC | PRN

## 2020-09-17 MED ORDER — PANTOPRAZOLE SODIUM 40 MG PO TBEC: 40.0000 mg | DELAYED_RELEASE_TABLET | Freq: Every day | ORAL | Status: DC

## 2020-09-17 MED ORDER — SODIUM CHLORIDE 0.9 % IV BOLUS (SEPSIS)
1000.0000 mL | Freq: Once | INTRAVENOUS | Status: AC
  Administered 2020-09-17: 1000 mL via INTRAVENOUS

## 2020-09-17 MED ORDER — DEXAMETHASONE SODIUM PHOSPHATE 10 MG/ML IJ SOLN
INTRAMUSCULAR | Status: AC
  Filled 2020-09-17: qty 1

## 2020-09-17 MED ORDER — PANTOPRAZOLE SODIUM 40 MG IV SOLR
40.0000 mg | Freq: Every day | INTRAVENOUS | Status: DC
  Administered 2020-09-17: 40 mg via INTRAVENOUS
  Filled 2020-09-17: qty 40

## 2020-09-17 MED ORDER — HYDROMORPHONE HCL 1 MG/ML IJ SOLN: 0.5000 mg | INTRAMUSCULAR | Status: DC | PRN

## 2020-09-17 MED ORDER — METHOCARBAMOL 500 MG PO TABS: 500.0000 mg | ORAL_TABLET | Freq: Four times a day (QID) | ORAL | Status: DC | PRN

## 2020-09-17 MED ORDER — ENOXAPARIN SODIUM 40 MG/0.4ML IJ SOSY: 40.0000 mg | PREFILLED_SYRINGE | INTRAMUSCULAR | Status: DC

## 2020-09-17 MED ORDER — MIDAZOLAM HCL 2 MG/2ML IJ SOLN
INTRAMUSCULAR | Status: DC | PRN
  Administered 2020-09-17: 2 mg via INTRAVENOUS

## 2020-09-17 MED ORDER — SODIUM CHLORIDE 0.9 % IV SOLN
2.0000 g | Freq: Once | INTRAVENOUS | Status: AC
  Administered 2020-09-17: 2 g via INTRAVENOUS
  Filled 2020-09-17: qty 20

## 2020-09-17 MED ORDER — SODIUM CHLORIDE 0.9 % IV SOLN: INTRAVENOUS | Status: DC

## 2020-09-17 MED ORDER — ROCURONIUM BROMIDE 10 MG/ML (PF) SYRINGE
PREFILLED_SYRINGE | INTRAVENOUS | Status: AC
  Filled 2020-09-17: qty 10

## 2020-09-17 MED ORDER — OXYCODONE HCL 5 MG PO TABS: 5.0000 mg | ORAL_TABLET | ORAL | Status: DC | PRN

## 2020-09-17 MED ORDER — SUGAMMADEX SODIUM 200 MG/2ML IV SOLN
INTRAVENOUS | Status: DC | PRN
  Administered 2020-09-17: 200 mg via INTRAVENOUS

## 2020-09-17 MED ORDER — ADULT MULTIVITAMIN W/MINERALS CH
1.0000 | ORAL_TABLET | Freq: Every day | ORAL | Status: DC
  Administered 2020-09-17 – 2020-09-18 (×2): 1 via ORAL
  Filled 2020-09-17 (×2): qty 1

## 2020-09-17 MED ORDER — DIPHENHYDRAMINE HCL 50 MG/ML IJ SOLN: 25.0000 mg | Freq: Four times a day (QID) | INTRAMUSCULAR | Status: DC | PRN

## 2020-09-17 MED ORDER — KETOROLAC TROMETHAMINE 15 MG/ML IJ SOLN: 15.0000 mg | Freq: Four times a day (QID) | INTRAMUSCULAR | Status: DC | PRN

## 2020-09-17 MED ORDER — ONDANSETRON HCL 4 MG/2ML IJ SOLN: 4.0000 mg | Freq: Four times a day (QID) | INTRAMUSCULAR | Status: DC | PRN

## 2020-09-17 MED ORDER — STERILE WATER FOR IRRIGATION IR SOLN
Status: DC | PRN
  Administered 2020-09-17: 1000 mL

## 2020-09-17 MED ORDER — SIMETHICONE 80 MG PO CHEW: 40.0000 mg | CHEWABLE_TABLET | Freq: Four times a day (QID) | ORAL | Status: DC | PRN

## 2020-09-17 MED ORDER — ONDANSETRON HCL 4 MG/2ML IJ SOLN
4.0000 mg | Freq: Once | INTRAMUSCULAR | Status: AC
  Administered 2020-09-17: 4 mg via INTRAVENOUS
  Filled 2020-09-17: qty 2

## 2020-09-17 MED ORDER — BUPIVACAINE-EPINEPHRINE (PF) 0.25% -1:200000 IJ SOLN
INTRAMUSCULAR | Status: AC
  Filled 2020-09-17: qty 30

## 2020-09-17 MED ORDER — POLYETHYLENE GLYCOL 3350 17 G PO PACK: 17.0000 g | PACK | Freq: Every day | ORAL | Status: DC | PRN

## 2020-09-17 MED ORDER — HYDRALAZINE HCL 20 MG/ML IJ SOLN: 10.0000 mg | INTRAMUSCULAR | Status: DC | PRN

## 2020-09-17 MED ORDER — FENTANYL CITRATE (PF) 100 MCG/2ML IJ SOLN: 25.0000 ug | INTRAMUSCULAR | Status: DC | PRN

## 2020-09-17 MED ORDER — FAMOTIDINE 20 MG PO TABS
20.0000 mg | ORAL_TABLET | Freq: Two times a day (BID) | ORAL | Status: DC
  Administered 2020-09-17 – 2020-09-18 (×3): 20 mg via ORAL
  Filled 2020-09-17 (×3): qty 1

## 2020-09-17 MED ORDER — LIDOCAINE 2% (20 MG/ML) 5 ML SYRINGE
INTRAMUSCULAR | Status: AC
  Filled 2020-09-17: qty 5

## 2020-09-17 MED ORDER — MIDAZOLAM HCL 2 MG/2ML IJ SOLN
INTRAMUSCULAR | Status: AC
  Filled 2020-09-17: qty 2

## 2020-09-17 MED ORDER — SODIUM CHLORIDE 0.9 % IV SOLN
2.0000 g | INTRAVENOUS | Status: AC
  Administered 2020-09-18: 2 g via INTRAVENOUS
  Filled 2020-09-17: qty 20

## 2020-09-17 MED ORDER — ACETAMINOPHEN 500 MG PO TABS
1000.0000 mg | ORAL_TABLET | Freq: Four times a day (QID) | ORAL | Status: DC
  Administered 2020-09-17 – 2020-09-18 (×3): 1000 mg via ORAL
  Filled 2020-09-17 (×3): qty 2

## 2020-09-17 MED ORDER — SODIUM CHLORIDE 0.9 % IV SOLN: 2.0000 g | INTRAVENOUS | Status: DC

## 2020-09-17 MED ORDER — DOCUSATE SODIUM 100 MG PO CAPS: 100.0000 mg | ORAL_CAPSULE | Freq: Two times a day (BID) | ORAL | Status: DC

## 2020-09-17 MED ORDER — PROPOFOL 10 MG/ML IV BOLUS
INTRAVENOUS | Status: DC | PRN
  Administered 2020-09-17: 140 mg via INTRAVENOUS

## 2020-09-17 MED ORDER — METOPROLOL TARTRATE 5 MG/5ML IV SOLN: 5.0000 mg | Freq: Four times a day (QID) | INTRAVENOUS | Status: DC | PRN

## 2020-09-17 MED ORDER — LACTATED RINGERS IV SOLN: INTRAVENOUS | Status: DC | PRN

## 2020-09-17 MED ORDER — FENTANYL CITRATE (PF) 250 MCG/5ML IJ SOLN
INTRAMUSCULAR | Status: DC | PRN
  Administered 2020-09-17: 50 ug via INTRAVENOUS
  Administered 2020-09-17: 100 ug via INTRAVENOUS

## 2020-09-17 MED ORDER — SODIUM CHLORIDE 0.9 % IV SOLN
1000.0000 mL | INTRAVENOUS | Status: DC
  Administered 2020-09-17: 1000 mL via INTRAVENOUS

## 2020-09-17 MED ORDER — BUPIVACAINE-EPINEPHRINE 0.25% -1:200000 IJ SOLN
INTRAMUSCULAR | Status: DC | PRN
  Administered 2020-09-17: 20 mL

## 2020-09-17 MED ORDER — SUCCINYLCHOLINE CHLORIDE 200 MG/10ML IV SOSY
PREFILLED_SYRINGE | INTRAVENOUS | Status: AC
  Filled 2020-09-17: qty 10

## 2020-09-17 MED ORDER — PROMETHAZINE HCL 25 MG/ML IJ SOLN: 6.2500 mg | INTRAMUSCULAR | Status: DC | PRN

## 2020-09-17 MED ORDER — PROPOFOL 10 MG/ML IV BOLUS
INTRAVENOUS | Status: AC
  Filled 2020-09-17: qty 20

## 2020-09-17 MED ORDER — ONDANSETRON HCL 4 MG/2ML IJ SOLN
INTRAMUSCULAR | Status: DC | PRN
  Administered 2020-09-17: 4 mg via INTRAVENOUS

## 2020-09-17 MED ORDER — DEXAMETHASONE SODIUM PHOSPHATE 10 MG/ML IJ SOLN
INTRAMUSCULAR | Status: DC | PRN
  Administered 2020-09-17: 10 mg via INTRAVENOUS

## 2020-09-17 MED ORDER — ENOXAPARIN SODIUM 40 MG/0.4ML IJ SOSY
40.0000 mg | PREFILLED_SYRINGE | INTRAMUSCULAR | Status: DC
  Administered 2020-09-18: 40 mg via SUBCUTANEOUS
  Filled 2020-09-17: qty 0.4

## 2020-09-17 MED ORDER — MORPHINE SULFATE (PF) 4 MG/ML IV SOLN
4.0000 mg | Freq: Once | INTRAVENOUS | Status: AC
  Administered 2020-09-17: 4 mg via INTRAVENOUS
  Filled 2020-09-17: qty 1

## 2020-09-17 MED ORDER — ROCURONIUM BROMIDE 10 MG/ML (PF) SYRINGE
PREFILLED_SYRINGE | INTRAVENOUS | Status: DC | PRN
  Administered 2020-09-17: 50 mg via INTRAVENOUS

## 2020-09-17 MED ORDER — ONDANSETRON 4 MG PO TBDP: 4.0000 mg | ORAL_TABLET | Freq: Four times a day (QID) | ORAL | Status: DC | PRN

## 2020-09-17 MED ORDER — TRAMADOL HCL 50 MG PO TABS: 50.0000 mg | ORAL_TABLET | Freq: Four times a day (QID) | ORAL | Status: DC | PRN

## 2020-09-17 MED ORDER — DIPHENHYDRAMINE HCL 25 MG PO CAPS: 25.0000 mg | ORAL_CAPSULE | Freq: Four times a day (QID) | ORAL | Status: DC | PRN

## 2020-09-17 MED ORDER — OXYCODONE HCL 5 MG PO TABS
5.0000 mg | ORAL_TABLET | Freq: Once | ORAL | Status: DC | PRN
Start: 2020-09-17 — End: 2020-09-17

## 2020-09-17 MED ORDER — LIDOCAINE 2% (20 MG/ML) 5 ML SYRINGE
INTRAMUSCULAR | Status: DC | PRN
  Administered 2020-09-17: 20 mg via INTRAVENOUS
  Administered 2020-09-17: 80 mg via INTRAVENOUS

## 2020-09-17 MED ORDER — BISACODYL 10 MG RE SUPP: 10.0000 mg | Freq: Every day | RECTAL | Status: DC | PRN

## 2020-09-17 MED ORDER — 0.9 % SODIUM CHLORIDE (POUR BTL) OPTIME
TOPICAL | Status: DC | PRN
  Administered 2020-09-17: 1000 mL

## 2020-09-17 MED ORDER — OXYCODONE HCL 5 MG/5ML PO SOLN: 5.0000 mg | Freq: Once | ORAL | Status: DC | PRN

## 2020-09-17 SURGICAL SUPPLY — 39 items
APPLIER CLIP ROT 10 11.4 M/L (STAPLE) ×2
BLADE CLIPPER SURG (BLADE) ×2 IMPLANT
CANISTER SUCT 3000ML PPV (MISCELLANEOUS) ×2 IMPLANT
CHLORAPREP W/TINT 26 (MISCELLANEOUS) ×2 IMPLANT
CLIP APPLIE ROT 10 11.4 M/L (STAPLE) ×1 IMPLANT
COVER MAYO STAND STRL (DRAPES) IMPLANT
COVER SURGICAL LIGHT HANDLE (MISCELLANEOUS) ×2 IMPLANT
COVER WAND RF STERILE (DRAPES) IMPLANT
DERMABOND ADVANCED (GAUZE/BANDAGES/DRESSINGS) ×1
DERMABOND ADVANCED .7 DNX12 (GAUZE/BANDAGES/DRESSINGS) ×1 IMPLANT
DRAPE C-ARM 42X120 X-RAY (DRAPES) IMPLANT
ELECT REM PT RETURN 9FT ADLT (ELECTROSURGICAL) ×2
ELECTRODE REM PT RTRN 9FT ADLT (ELECTROSURGICAL) ×1 IMPLANT
GLOVE SURG ENC TEXT LTX SZ6.5 (GLOVE) ×12 IMPLANT
GLOVE SURG MICRO LTX SZ7 (GLOVE) ×2 IMPLANT
GLOVE SURG ORTHO 8.0 STRL STRW (GLOVE) ×2 IMPLANT
GLOVE SURG ORTHO LTX SZ8 (GLOVE) ×2 IMPLANT
GOWN STRL REUS W/ TWL LRG LVL3 (GOWN DISPOSABLE) ×3 IMPLANT
GOWN STRL REUS W/ TWL XL LVL3 (GOWN DISPOSABLE) ×1 IMPLANT
GOWN STRL REUS W/TWL LRG LVL3 (GOWN DISPOSABLE) ×3
GOWN STRL REUS W/TWL XL LVL3 (GOWN DISPOSABLE) ×1
KIT BASIN OR (CUSTOM PROCEDURE TRAY) ×2 IMPLANT
KIT TURNOVER KIT B (KITS) ×2 IMPLANT
NS IRRIG 1000ML POUR BTL (IV SOLUTION) ×2 IMPLANT
PAD ARMBOARD 7.5X6 YLW CONV (MISCELLANEOUS) ×4 IMPLANT
POUCH SPECIMEN RETRIEVAL 10MM (ENDOMECHANICALS) ×2 IMPLANT
SCISSORS LAP 5X35 DISP (ENDOMECHANICALS) ×2 IMPLANT
SET CHOLANGIOGRAPH 5 50 .035 (SET/KITS/TRAYS/PACK) IMPLANT
SET IRRIG TUBING LAPAROSCOPIC (IRRIGATION / IRRIGATOR) ×2 IMPLANT
SET TUBE SMOKE EVAC HIGH FLOW (TUBING) IMPLANT
SLEEVE ENDOPATH XCEL 5M (ENDOMECHANICALS) ×2 IMPLANT
SUT MNCRL AB 4-0 PS2 18 (SUTURE) ×2 IMPLANT
TOWEL GREEN STERILE (TOWEL DISPOSABLE) ×2 IMPLANT
TOWEL GREEN STERILE FF (TOWEL DISPOSABLE) ×2 IMPLANT
TRAY LAPAROSCOPIC MC (CUSTOM PROCEDURE TRAY) ×2 IMPLANT
TROCAR XCEL BLUNT TIP 100MML (ENDOMECHANICALS) ×2 IMPLANT
TROCAR XCEL NON-BLD 11X100MML (ENDOMECHANICALS) ×2 IMPLANT
TROCAR XCEL NON-BLD 5MMX100MML (ENDOMECHANICALS) ×2 IMPLANT
WATER STERILE IRR 1000ML POUR (IV SOLUTION) ×2 IMPLANT

## 2020-09-17 NOTE — ED Triage Notes (Signed)
Pt c/o RUQ abdominal pain starting 2 hours ago. Tenderness on palpation. Denies NVD.   Seen at Urgent Care yesterday for epigastric pain. Pain now resolved.

## 2020-09-17 NOTE — Discharge Instructions (Signed)
[facility] LAPAROSCOPIC SURGERY: POST OP INSTRUCTIONS Always review your discharge instruction sheet given to you by the facility where your surgery was performed. IF YOU HAVE DISABILITY OR FAMILY LEAVE FORMS, YOU MUST BRING THEM TO THE OFFICE FOR PROCESSING.   DO NOT GIVE THEM TO YOUR DOCTOR.  PAIN CONTROL  1. First take acetaminophen (Tylenol) AND/or ibuprofen (Advil) to control your pain after surgery.  Follow directions on package.  Taking acetaminophen (Tylenol) and/or ibuprofen (Advil) regularly after surgery will help to control your pain and lower the amount of prescription pain medication you may need.  You should not take more than 3,000 mg (3 grams) of acetaminophen (Tylenol) in 24 hours.  You should not take ibuprofen (Advil), aleve, motrin, naprosyn or other NSAIDS if you have a history of stomach ulcers or chronic kidney disease.  2. A prescription for pain medication may be given to you upon discharge.  Take your pain medication as prescribed, if you still have uncontrolled pain after taking acetaminophen (Tylenol) or ibuprofen (Advil). 3. Use ice packs to help control pain. 4. If you need a refill on your pain medication, please contact your pharmacy.  They will contact our office to request authorization. Prescriptions will not be filled after 5pm or on week-ends.  HOME MEDICATIONS 5. Take your usually prescribed medications unless otherwise directed.  DIET 6. You should follow a light diet the first few days after arrival home.  Be sure to include lots of fluids daily. Avoid fatty, fried foods.   CONSTIPATION 7. It is common to experience some constipation after surgery and if you are taking pain medication.  Increasing fluid intake and taking a stool softener (such as Colace) will usually help or prevent this problem from occurring.  A mild laxative (Milk of Magnesia or Miralax) should be taken according to package instructions if there are no bowel  movements after 48 hours.  WOUND/INCISION CARE 8. Most patients will experience some swelling and bruising in the area of the incisions.  Ice packs will help.  Swelling and bruising can take several days to resolve.  9. Unless discharge instructions indicate otherwise, follow guidelines below  a. STERI-STRIPS - you may remove your outer bandages 48 hours after surgery, and you may shower at that time.  You have steri-strips (small skin tapes) in place directly over the incision.  These strips should be left on the skin for 7-10 days.   b. DERMABOND/SKIN GLUE - you may shower in 24 hours.  The glue will flake off over the next 2-3 weeks. 10. Any sutures or staples will be removed at the office during your follow-up visit.  ACTIVITIES 11. You may resume regular (light) daily activities beginning the next day--such as daily self-care, walking, climbing stairs--gradually increasing activities as tolerated.  You may have sexual intercourse when it is comfortable.  Refrain from any heavy lifting or straining until approved by your doctor. a. You may drive when you are no longer taking prescription pain medication, you can comfortably wear a seatbelt, and you can safely maneuver your car and apply brakes.  FOLLOW-UP 12. You should see your doctor in the office for a follow-up appointment approximately 2-3 weeks after your surgery.  You should have been given your post-op/follow-up appointment when your surgery was scheduled.  If you did not receive a post-op/follow-up appointment, make sure that you call for this appointment within a day or two after you arrive home to insure a convenient appointment time.     WHEN TO CALL YOUR DOCTOR: 1. Fever over 101.0 2. Inability to urinate 3. Continued bleeding from incision. 4. Increased pain, redness, or drainage from the incision. 5. Increasing abdominal pain  The clinic staff is available to answer your questions during regular business hours.  Please don't  hesitate to call and ask to speak to one of the nurses for clinical concerns.  If you have a medical emergency, go to the nearest emergency room or call 911.  A surgeon from [facility] is always on call at the hospital. [address], [address], [place], [place]  [address] ? [address] [address], [place], [place]   [address] [phone] ? [phone] ? FAX [phone] Web site: [email or web address]  .........   Managing Your Pain After Surgery Without Opioids    Thank you for participating in our program to help patients manage their pain after surgery without opioids. This is part of our effort to provide you with the best care possible, without exposing you or your family to the risk that opioids pose.  What pain can I expect after surgery? You can expect to have some pain after surgery. This is normal. The pain is typically worse the day after surgery, and quickly begins to get better. Many studies have found that many patients are able to manage their pain after surgery with Over-the-Counter (OTC) medications such as Tylenol and Motrin. If you have a condition that does not allow you to take Tylenol or Motrin, notify your surgical team.  How will I manage my pain? The best strategy for controlling your pain after surgery is around the clock pain control with Tylenol (acetaminophen) and Motrin (ibuprofen or Advil). Alternating these medications with each other allows you to maximize your pain control. In addition to Tylenol and Motrin, you can use heating pads or ice packs on your incisions to help reduce your pain.  How will I alternate your regular strength over-the-counter pain medication? You will take a dose of pain medication every three hours. ; Start by taking 650 mg of Tylenol (2 pills of 325 mg) ; 3 hours later take 600 mg of Motrin (3 pills of 200 mg) ; 3 hours after taking the Motrin take 650 mg of Tylenol ; 3 hours after that take 600 mg of  Motrin.   - 1 -  See example - if your first dose of Tylenol is at 12:00 PM   12:00 PM Tylenol 650 mg (2 pills of 325 mg)  3:00 PM Motrin 600 mg (3 pills of 200 mg)  6:00 PM Tylenol 650 mg (2 pills of 325 mg)  9:00 PM Motrin 600 mg (3 pills of 200 mg)  Continue alternating every 3 hours   We recommend that you follow this schedule around-the-clock for at least 3 days after surgery, or until you feel that it is no longer needed. Use the table on the last page of this handout to keep track of the medications you are taking. Important: Do not take more than 3000mg of Tylenol or 3200mg of Motrin in a 24-hour period. Do not take ibuprofen/Motrin if you have a history of bleeding stomach ulcers, severe kidney disease, &/or actively taking a blood thinner  What if I still have pain? If you have pain that is not controlled with the over-the-counter pain medications (Tylenol and Motrin or Advil) you might have what we call "breakthrough" pain. You will receive a prescription for a small amount of an opioid pain medication such as   Oxycodone, Tramadol, or Tylenol with Codeine. Use these opioid pills in the first 24 hours after surgery if you have breakthrough pain. Do not take more than 1 pill every 4-6 hours.  If you still have uncontrolled pain after using all opioid pills, don't hesitate to call our staff using the number provided. We will help make sure you are managing your pain in the best way possible, and if necessary, we can provide a prescription for additional pain medication.   Day 1    Time  Name of Medication Number of pills taken  Amount of Acetaminophen  Pain Level   Comments  AM PM       AM PM       AM PM       AM PM       AM PM       AM PM       AM PM       AM PM       Total Daily amount of Acetaminophen Do not take more than  3,000 mg per day      Day 2    Time  Name of Medication Number of pills taken  Amount of Acetaminophen  Pain Level   Comments  AM  PM       AM PM       AM PM       AM PM       AM PM       AM PM       AM PM       AM PM       Total Daily amount of Acetaminophen Do not take more than  3,000 mg per day      Day 3    Time  Name of Medication Number of pills taken  Amount of Acetaminophen  Pain Level   Comments  AM PM       AM PM       AM PM       AM PM          AM PM       AM PM       AM PM       AM PM       Total Daily amount of Acetaminophen Do not take more than  3,000 mg per day      Day 4    Time  Name of Medication Number of pills taken  Amount of Acetaminophen  Pain Level   Comments  AM PM       AM PM       AM PM       AM PM       AM PM       AM PM       AM PM       AM PM       Total Daily amount of Acetaminophen Do not take more than  3,000 mg per day      Day 5    Time  Name of Medication Number of pills taken  Amount of Acetaminophen  Pain Level   Comments  AM PM       AM PM       AM PM       AM PM       AM PM       AM PM       AM PM         AM PM       Total Daily amount of Acetaminophen Do not take more than  3,000 mg per day       Day 6    Time  Name of Medication Number of pills taken  Amount of Acetaminophen  Pain Level  Comments  AM PM       AM PM       AM PM       AM PM       AM PM       AM PM       AM PM       AM PM       Total Daily amount of Acetaminophen Do not take more than  3,000 mg per day      Day 7    Time  Name of Medication Number of pills taken  Amount of Acetaminophen  Pain Level   Comments  AM PM       AM PM       AM PM       AM PM       AM PM       AM PM       AM PM       AM PM       Total Daily amount of Acetaminophen Do not take more than  3,000 mg per day        For additional information about how and where to safely dispose of unused opioid medications - https://www.morepowerfulnc.org  Disclaimer: This document contains information and/or instructional materials adapted from [facility]  for the typical patient with your condition. It does not replace medical advice from your health care provider because your experience may differ from that of the typical patient. Talk to your health care provider if you have any questions about this document, your condition or your treatment plan. Adapted from [facility]  

## 2020-09-17 NOTE — ED Provider Notes (Signed)
[facility] EMERGENCY DEPARTMENT Provider Note  CSN: [id number] Arrival date & time: 09/17/20 7209  Chief Complaint(s) Abdominal Pain  HPI Luis Simmons is a 50 y.o. male with a reported history of peptic ulcer disease here for right upper quadrant abdominal pain. Onset 3 hours ago. Described as a throbbing pain. Severe. Worse with palpation and taking deep breaths. No alleviating factor. Nausea without emesis. No fevers or chills. No coughing or congestion. Patient reported that for the past 2 days he has had epigastric abdominal pain which subsided just before the right upper quadrant pain began. He attempted to take Protonix and other over-the-counter medication at home with minimal relief.   HPI  Past Medical History Past Medical History:  Diagnosis Date  . Diet-controlled hyperlipidemia    Patient Active Problem List   Diagnosis Date Noted  . Acute idiopathic pericarditis 03/10/2016  . Chest pain, unspecified 01/17/2016  . Abnormal finding on EKG 01/17/2016   Home Medication(s) Prior to Admission medications   Medication Sig Start Date End Date Taking? Authorizing Provider  famotidine (PEPCID) 20 MG tablet Take 20 mg by mouth 2 (two) times daily.   Yes [provider]  Multiple Vitamin (MULTIVITAMIN) capsule Take 1 capsule by mouth daily.   Yes [provider]  pantoprazole (PROTONIX) 40 MG tablet Take 40 mg by mouth daily. 09/07/20  Yes [provider]                                                                                                                                    Past Surgical History History reviewed. No pertinent surgical history. Family History Family History  Problem Relation Age of Onset  . CAD Father        Bypass at age 51    Social History Social History   Tobacco Use  . Smoking status: Never Smoker  . Smokeless tobacco: Never Used  Vaping Use  . Vaping Use: Never used  Substance Use  Topics  . Alcohol use: No  . Drug use: Never   Allergies Patient has no known allergies.  Review of Systems Review of Systems All other systems are reviewed and are negative for acute change except as noted in the HPI  Physical Exam Vital Signs  I have reviewed the triage vital signs BP (!) 114/97 (BP Location: Left Arm)   Pulse 96   Temp 98 F (36.7 C) (Oral)   Resp 18   Ht 5\' 8"  (1.727 m)   Wt 75.3 kg   SpO2 96%   BMI 25.24 kg/m   Physical Exam Vitals reviewed.  Constitutional:      General: He is not in acute distress.    Appearance: He is well-developed. He is not diaphoretic.  HENT:     Head: Normocephalic and atraumatic.     Jaw: No trismus.     Right Ear: External ear normal.  Left Ear: External ear normal.     Nose: Nose normal.  Eyes:     General: No scleral icterus.    Conjunctiva/sclera: Conjunctivae normal.  Neck:     Trachea: Phonation normal.  Cardiovascular:     Rate and Rhythm: Normal rate and regular rhythm.  Pulmonary:     Effort: Pulmonary effort is normal. No respiratory distress.     Breath sounds: No stridor.  Abdominal:     General: There is no distension.     Tenderness: There is abdominal tenderness in the right upper quadrant. There is no right CVA tenderness, guarding or rebound. Positive signs include Murphy's sign.     Hernia: No hernia is present.  Musculoskeletal:        General: Normal range of motion.     Cervical back: Normal range of motion.  Neurological:     Mental Status: He is alert and oriented to person, place, and time.  Psychiatric:        Behavior: Behavior normal.     ED Results and Treatments Labs (all labs ordered are listed, but only abnormal results are displayed) Labs Reviewed  COMPREHENSIVE METABOLIC PANEL - Abnormal; Notable for the following components:      Result Value   Sodium 133 (*)    Chloride 96 (*)    Glucose, Bld 148 (*)    Total Bilirubin 1.3 (*)    All other components within  normal limits  CBC - Abnormal; Notable for the following components:   WBC 14.4 (*)    Hemoglobin 17.9 (*)    MCHC 36.1 (*)    All other components within normal limits  SARS CORONAVIRUS 2 (TAT 6-24 HRS)  LIPASE, BLOOD  URINALYSIS, ROUTINE W REFLEX MICROSCOPIC                                                                                                                         EKG  EKG Interpretation  Date/Time:    Ventricular Rate:    PR Interval:    QRS Duration:   QT Interval:    QTC Calculation:   R Axis:     Text Interpretation:        Radiology US Abdomen Limited RUQ (LIVER/GB)  Result Date: 09/17/2020 CLINICAL DATA:  Right upper quadrant pain. EXAM: ULTRASOUND ABDOMEN LIMITED RIGHT UPPER QUADRANT COMPARISON:  No prior. FINDINGS: Gallbladder: Gallstones and sludge noted. Gallbladder wall thickening to 6.2 mm. Small amount of pericholecystic fluid noted. Positive Murphy sign. Findings suggest cholecystitis. Common bile duct: Diameter: 4 8 mm Liver: Echogenicity normal. 3.0 x 2.2 x 4.1 cm heterogeneous mass is noted in the left hepatic lobe. Further evaluation with MRI is suggested. Portal vein is patent on color Doppler imaging with normal direction of blood flow towards the liver. Other: Exam limited by bowel gas and patient tenderness. IMPRESSION: 1. Gallstones and sludge noted. Gallbladder wall is thickened to 6.2 mm. Small amount of pericholecystic fluid noted. Positive Murphy sign. Findings  consistent with cholecystitis. No biliary distention. 2. 3.0 x 2.2 x 4.1 cm heterogeneous mass in the left hepatic lobe. MRI is suggested for further evaluation. Electronically Signed   By: Maisie Fus  Register   On: 09/17/2020 05:49    Pertinent labs & imaging results that were available during my care of the patient were reviewed by me and considered in my medical decision making (see chart for details).  Medications Ordered in ED Medications  sodium chloride 0.9 % bolus 1,000 mL (1,000  mLs Intravenous New Bag/Given 09/17/20 0501)    Followed by  0.9 %  sodium chloride infusion (1,000 mLs Intravenous New Bag/Given 09/17/20 0547)  HYDROmorphone (DILAUDID) injection 1 mg (has no administration in time range)  cefTRIAXone (ROCEPHIN) 2 g in sodium chloride 0.9 % 100 mL IVPB (has no administration in time range)  ondansetron (ZOFRAN) injection 4 mg (4 mg Intravenous Given 09/17/20 0502)  morphine 4 MG/ML injection 4 mg (4 mg Intravenous Given 09/17/20 0502)                                                                                                                                    Procedures .1-3 Lead EKG Interpretation Performed by: Nira Conn, MD Authorized by: Nira Conn, MD     Interpretation: normal     ECG rate:  94   ECG rate assessment: normal     Rhythm: sinus rhythm     Ectopy: none     Conduction: normal   Ultrasound ED Abd  Date/Time: 09/17/2020 4:52 AM Performed by: Nira Conn, MD Authorized by: Nira Conn, MD   Procedure details:    Indications: abdominal pain     Assessment for:  Gallstones   Hepatobiliary:  Visualized       Hepatobiliary findings:    Common bile duct:  Unable to visualize   Gallbladder wall:  Abnormal   Gallbladder wall thickened (>3 mm): yes     Gallbladder stones: identified     Intra-abdominal fluid: not identified     Sonographic Murphy's sign: positive      (including critical care time)  Medical Decision Making / ED Course I have reviewed the nursing notes for this encounter and the patient's prior records (if available in EHR or on provided paperwork).   Luis Simmons was evaluated in Emergency Department on 09/17/2020 for the symptoms described in the history of present illness. He was evaluated in the context of the global COVID-19 pandemic, which necessitated consideration that the patient might be at risk for infection with the SARS-CoV-2 virus that causes COVID-19.  Institutional protocols and algorithms that pertain to the evaluation of patients at risk for COVID-19 are in a state of rapid change based on information released by regulatory bodies including the CDC and federal and state organizations. These policies and algorithms were followed during the patient's care in the ED.  Right upper quadrant pain  with tenderness to palpation and positive Murphy sign. On review of records, patient had an MRI in 2017 revealing gallstones. Bedside ultrasound here notable for numerous gallstones with a distended gallbladder and wall thickening. No pericholecystic fluid. Patient does have a positive sonographic Murphy sign. Acute cholecystitis versus choledocholithiasis. We will also assess for pancreatitis. Low suspicion for other serious intra-abdominal inflammatory/infectious process requiring CT at this time.  Patient provided with IV fluids, pain medicine, and nausea medicine.  CBC notable for leukocytosis. Metabolic panel without evidence of biliary obstruction or pancreatitis.  Formal right upper quadrant ultrasound consistent with acute cholecystitis.  Patient started on empiric antibiotics. Will admit to surgery for further operative management.      Final Clinical Impression(s) / ED Diagnoses Final diagnoses:  Abdominal pain, RUQ  Acute cholecystitis      This chart was dictated using voice recognition software.  Despite best efforts to proofread,  errors can occur which can change the documentation meaning.   Nira Conn, MD 09/17/20 [phone]

## 2020-09-17 NOTE — H&P (Signed)
Surgical Evaluation  Chief Complaint: Abdominal pain  HPI: Very pleasant and otherwise healthy 50 year old man who presents with acute onset right upper quadrant pain.  This is associated with nausea, no known fevers or chills.  Worse with deep breaths and palpation, is throbbing intense sensation.  This began approximately 3 hours prior to presentation and he has never had any similar prior symptoms.  He does note that he had epigastric abdominal pain for the 2 days leading up to this but this has subsided.  He does have a history of peptic ulcer disease.  No previous abdominal surgery.  He is a Education officer, community.  His wife is with him at the bedside.  No Known Allergies  Past Medical History:  Diagnosis Date  . Diet-controlled hyperlipidemia     History reviewed. No pertinent surgical history.  Family History  Problem Relation Age of Onset  . CAD Father        Bypass at age 24    Social History   Socioeconomic History  . Marital status: Married    Spouse name: Not on file  . Number of children: Not on file  . Years of education: Not on file  . Highest education level: Not on file  Occupational History  . Not on file  Tobacco Use  . Smoking status: Never Smoker  . Smokeless tobacco: Never Used  Vaping Use  . Vaping Use: Never used  Substance and Sexual Activity  . Alcohol use: No  . Drug use: Never  . Sexual activity: Not on file  Other Topics Concern  . Not on file  Social History Narrative  . Not on file   Social Determinants of Health   Financial Resource Strain: Not on file  Food Insecurity: Not on file  Transportation Needs: Not on file  Physical Activity: Not on file  Stress: Not on file  Social Connections: Not on file    No current facility-administered medications on file prior to encounter.   Current Outpatient Medications on File Prior to Encounter  Medication Sig Dispense Refill  . famotidine (PEPCID) 20 MG tablet Take 20 mg by mouth 2 (two) times  daily.    . Multiple Vitamin (MULTIVITAMIN) capsule Take 1 capsule by mouth daily.    . pantoprazole (PROTONIX) 40 MG tablet Take 40 mg by mouth daily.      Review of Systems: a complete, 10pt review of systems was completed with pertinent positives and negatives as documented in the HPI  Physical Exam: Vitals:   09/17/20 0500 09/17/20 0600  BP: 123/85 (!) 123/98  Pulse: 88 85  Resp: 18 18  Temp:    SpO2: 100% 100%   Gen: A&Ox3, no distress  Eyes: lids and conjunctivae normal, no icterus.  Chest: respiratory effort is normal. No crepitus or tenderness on palpation of the chest.  Cardiovascular: RRR with palpable distal pulses, no pedal edema Gastrointestinal: soft, nondistended, mildly tender in the epigastrium, right subcostal field and lower chest wall and anterior axillary line.  No guarding.  No mass, hepatomegaly or splenomegaly. No hernia. Muscoloskeletal: no clubbing or cyanosis of the fingers.  Strength is symmetrical throughout.  Range of motion of bilateral upper and lower extremities normal without pain, crepitation or contracture. Neuro: GCS 15.  Sensation intact to light touch diffusely. Psych: appropriate mood and affect, normal insight/judgment intact  Skin: warm and dry   CBC Latest Ref Rng & Units 09/17/2020 01/17/2016  WBC 4.0 - 10.5 K/uL 14.4(H) 5.6  Hemoglobin 13.0 - 17.0  g/dL 17.9(H) 16.0  Hematocrit 39.0 - 52.0 % 49.6 45.1  Platelets 150 - 400 K/uL 234 172    CMP Latest Ref Rng & Units 09/17/2020 01/17/2016  Glucose 70 - 99 mg/dL 329(J) 188(C)  BUN 6 - 20 mg/dL 16 15  Creatinine 1.66 - 1.24 mg/dL 0.63 0.16  Sodium 010 - 145 mmol/L 133(L) 138  Potassium 3.5 - 5.1 mmol/L 3.8 4.0  Chloride 98 - 111 mmol/L 96(L) 105  CO2 22 - 32 mmol/L 25 26  Calcium 8.9 - 10.3 mg/dL 9.6 9.2  Total Protein 6.5 - 8.1 g/dL 7.1 9.3(A)  Total Bilirubin 0.3 - 1.2 mg/dL 3.5(T) 1.1  Alkaline Phos 38 - 126 U/L 61 64  AST 15 - 41 U/L 25 22  ALT 0 - 44 U/L 20 19    No results  found for: INR, PROTIME  Imaging: US Abdomen Limited RUQ (LIVER/GB)  Result Date: 09/17/2020 CLINICAL DATA:  Right upper quadrant pain. EXAM: ULTRASOUND ABDOMEN LIMITED RIGHT UPPER QUADRANT COMPARISON:  No prior. FINDINGS: Gallbladder: Gallstones and sludge noted. Gallbladder wall thickening to 6.2 mm. Small amount of pericholecystic fluid noted. Positive Murphy sign. Findings suggest cholecystitis. Common bile duct: Diameter: 4 8 mm Liver: Echogenicity normal. 3.0 x 2.2 x 4.1 cm heterogeneous mass is noted in the left hepatic lobe. Further evaluation with MRI is suggested. Portal vein is patent on color Doppler imaging with normal direction of blood flow towards the liver. Other: Exam limited by bowel gas and patient tenderness. IMPRESSION: 1. Gallstones and sludge noted. Gallbladder wall is thickened to 6.2 mm. Small amount of pericholecystic fluid noted. Positive Murphy sign. Findings consistent with cholecystitis. No biliary distention. 2. 3.0 x 2.2 x 4.1 cm heterogeneous mass in the left hepatic lobe. MRI is suggested for further evaluation. Electronically Signed   By: Maisie Fus  Register   On: 09/17/2020 05:49     A/P: 50 year old man with acute cholecystitis by clinical history and ultrasound as above. I recommend proceeding with laparoscopic cholecystectomy with possible cholangiogram. Discussed risks of surgery including bleeding, pain, scarring, intraabdominal injury specifically to the common bile duct and sequelae, bile leak, conversion to open surgery, blood clot, pneumonia, heart attack, stroke, failure to resolve symptoms, etc. Questions welcomed and answered.  Will plan to proceed later this morning with Dr. Gerrit Friends. Potential DC home this afternoon.   Regarding the mass noted in the left lobe of the liver on ultrasound, this was discovered incidentally on imaging for chest pain in 2017.  At the time this was described as a 4.3 cm enhancing lesion in the left hepatic lobe on the chest CT.  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  Was also noted to have a large gallstone at the time without cholecystitis.     Patient Active Problem List   Diagnosis Date Noted  . Acute cholecystitis 09/17/2020  . Acute idiopathic pericarditis 03/10/2016  . Chest pain, unspecified 01/17/2016  . Abnormal finding on EKG 01/17/2016       Phylliss Blakes, MD [facility]  See AMION to contact appropriate on-call provider

## 2020-09-17 NOTE — Interval H&P Note (Signed)
History and Physical Interval Note:  09/17/2020 9:20 AM  Luis Simmons  has presented today for surgery, with the diagnosis of cholecystitis.  The various methods of treatment have been discussed with the patient and family. After consideration of risks, benefits and other options for treatment, the patient has consented to    Procedure(s): LAPAROSCOPIC CHOLECYSTECTOMY WITH INTRAOPERATIVE CHOLANGIOGRAM (N/A) as a surgical intervention.    The patient's history has been reviewed, patient examined, no change in status, stable for surgery.  I have reviewed the patient's chart and labs.  Questions were answered to the patient's satisfaction.    Darnell Level, MD [facility] Office: [phone]   Darnell Level

## 2020-09-17 NOTE — Anesthesia Procedure Notes (Signed)
Procedure Name: Intubation Date/Time: 09/17/2020 10:27 AM Performed by: Noel Christmas, RN Pre-anesthesia Checklist: Patient identified, Emergency Drugs available, Suction available and Patient being monitored Patient Re-evaluated:Patient Re-evaluated prior to induction Oxygen Delivery Method: Circle system utilized Preoxygenation: Pre-oxygenation with 100% oxygen Induction Type: IV induction Ventilation: Mask ventilation without difficulty Laryngoscope Size: Glidescope and 4 Grade View: Grade I Tube type: Oral Tube size: 7.5 mm Number of attempts: 1 Airway Equipment and Method: Stylet and Oral airway Placement Confirmation: ETT inserted through vocal cords under direct vision,  positive ETCO2 and breath sounds checked- equal and bilateral Secured at: 22 cm Tube secured with: Tape Dental Injury: Teeth and Oropharynx as per pre-operative assessment

## 2020-09-17 NOTE — ED Notes (Signed)
Consent at bedside.  

## 2020-09-17 NOTE — Plan of Care (Signed)
  Problem: Clinical Measurements: Goal: Ability to maintain clinical measurements within normal limits will improve Outcome: Progressing Goal: Will remain free from infection Outcome: Progressing Goal: Respiratory complications will improve Outcome: Progressing   

## 2020-09-17 NOTE — Anesthesia Preprocedure Evaluation (Addendum)
Anesthesia Evaluation  Patient identified by MRN, date of birth, ID band Patient awake    Reviewed: Allergy & Precautions, NPO status , Patient's Chart, lab work & pertinent test results  History of Anesthesia Complications Negative for: history of anesthetic complications  Airway Mallampati: II  TM Distance: >3 FB Neck ROM: Full    Dental no notable dental hx.    Pulmonary neg pulmonary ROS,    Pulmonary exam normal        Cardiovascular negative cardio ROS Normal cardiovascular exam     Neuro/Psych negative neurological ROS  negative psych ROS   GI/Hepatic Neg liver ROS, GERD  Medicated and Controlled,Acute cholecystitis   Endo/Other  negative endocrine ROS  Renal/GU negative Renal ROS  negative genitourinary   Musculoskeletal negative musculoskeletal ROS (+)   Abdominal   Peds  Hematology negative hematology ROS (+)   Anesthesia Other Findings Day of surgery medications reviewed with patient.  Reproductive/Obstetrics negative OB ROS                            Anesthesia Physical Anesthesia Plan  ASA: II  Anesthesia Plan: General   Post-op Pain Management:    Induction: Intravenous  PONV Risk Score and Plan: 2 and Treatment may vary due to age or medical condition, Ondansetron, Dexamethasone and Midazolam  Airway Management Planned: Oral ETT  Additional Equipment: None  Intra-op Plan:   Post-operative Plan: Extubation in OR  Informed Consent: I have reviewed the patients History and Physical, chart, labs and discussed the procedure including the risks, benefits and alternatives for the proposed anesthesia with the patient or authorized representative who has indicated his/her understanding and acceptance.     Dental advisory given  Plan Discussed with: CRNA  Anesthesia Plan Comments:        Anesthesia Quick Evaluation

## 2020-09-17 NOTE — Anesthesia Postprocedure Evaluation (Signed)
Anesthesia Post Note  Patient: Luis Simmons  Procedure(s) Performed: LAPAROSCOPIC CHOLECYSTECTOMY (N/A )     Patient location during evaluation: PACU Anesthesia Type: General Level of consciousness: awake and alert and oriented Pain management: pain level controlled Vital Signs Assessment: post-procedure vital signs reviewed and stable Respiratory status: spontaneous breathing, nonlabored ventilation and respiratory function stable Cardiovascular status: blood pressure returned to baseline Postop Assessment: no apparent nausea or vomiting Anesthetic complications: no   No complications documented.  Last Vitals:  Vitals:   09/17/20 1233 09/17/20 1257  BP: 111/72 (!) 128/91  Pulse: 81 82  Resp: 16 16  Temp:    SpO2: 96% 95%    Last Pain:  Vitals:   09/17/20 1248  TempSrc:   PainSc: Asleep                 Kaylyn Layer

## 2020-09-17 NOTE — Transfer of Care (Signed)
Immediate Anesthesia Transfer of Care Note  Patient: Luis Simmons  Procedure(s) Performed: LAPAROSCOPIC CHOLECYSTECTOMY (N/A )  Patient Location: PACU  Anesthesia Type:General  Level of Consciousness: awake, alert  and oriented  Airway & Oxygen Therapy: Patient Spontanous Breathing  Post-op Assessment: Report given to RN, Post -op Vital signs reviewed and stable and Patient moving all extremities  Post vital signs: Reviewed and stable  Last Vitals:  Vitals Value Taken Time  BP 128/91 09/17/20 1257  Temp 36.6 C 09/17/20 1203  Pulse 79 09/17/20 1257  Resp 15 09/17/20 1257  SpO2 95 % 09/17/20 1257  Vitals shown include unvalidated device data.  Last Pain:  Vitals:   09/17/20 1248  TempSrc:   PainSc: Asleep         Complications: No complications documented.

## 2020-09-17 NOTE — Plan of Care (Signed)

## 2020-09-17 NOTE — Interval H&P Note (Signed)
History and Physical Interval Note:  09/17/2020 9:43 AM  Luis Simmons  has presented today for surgery, with the diagnosis of cholecystitis.  The various methods of treatment have been discussed with the patient and family. After consideration of risks, benefits and other options for treatment, the patient has consented to    Procedure(s): LAPAROSCOPIC CHOLECYSTECTOMY WITH INTRAOPERATIVE CHOLANGIOGRAM (N/A) as a surgical intervention.    The patient's history has been reviewed, patient examined, no change in status, stable for surgery.  I have reviewed the patient's chart and labs.  Questions were answered to the patient's satisfaction.    Darnell Level, MD [facility] Office: [phone]   Darnell Level

## 2020-09-17 NOTE — Op Note (Signed)
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  This is associated with nausea, no known fevers or chills.  Worse with deep breaths and palpation, is throbbing intense sensation.  This began approximately 3 hours prior to presentation and he has never had any similar prior symptoms.  He does note that he had epigastric abdominal pain for the 2 days leading up to this but this has subsided.  USN shows thick walled gallbladder at 6 mm with pericholecystic fluids and cholelithiasis.  Now for cholecystectomy.  Patient tested Covid positive in the ER.  Procedure Details:  The patient was seen in the pre-op holding area. The risks, benefits, complications, treatment options, and expected outcomes were previously discussed with the patient. The patient agreed with the proposed plan and has signed the informed consent form.  The patient was transported to operating room #1 at the [facility]. The patient was placed in the supine position on the operating room table. Following induction of general anesthesia, the abdomen was prepped and draped in the usual aseptic fashion.  An incision was made in the skin near the umbilicus. The midline fascia was incised and the peritoneal cavity was entered and a Hasson cannula was introduced under direct vision. The cannula was secured with a 0-Vicryl pursestring suture. Pneumoperitoneum was established with carbon dioxide. Additional cannulae were introduced under direct vision along the right costal margin in the midline, mid-clavicular line, and anterior  axillary line.   The gallbladder was identified.  It appeared acutely inflamed and potentially partially necrotic.  It was aspirated and the fundus grasped and retracted cephalad. Adhesions were taken down bluntly and the electrocautery was utilized as needed, taking care not to involve any adjacent structures. The infundibulum was grasped and retracted laterally, exposing the peritoneum overlying the triangle of Calot. The peritoneum was incised and structures exposed with blunt dissection. The cystic duct was clearly identified, bluntly dissected circumferentially, and clipped at the neck of the gallbladder.  The cystic duct was then ligated with ligaclips and divided. The cystic artery was identified, dissected circumferentially, ligated with ligaclips, and divided.  The gallbladder was dissected away from the gallbladder bed using the electrocautery for hemostasis. The gallbladder was completely removed from the liver and placed into an endocatch bag. The gallbladder was removed in the endocatch bag through the umbilical port site and submitted to pathology for review.  The right upper quadrant was irrigated and the gallbladder bed was inspected. Hemostasis was achieved with the electrocautery.  Cannulae were removed under direct vision and good hemostasis was noted. Pneumoperitoneum was released and the majority of the carbon dioxide evacuated. The umbilical wound was irrigated and the fascia was then closed with the pursestring suture.  Local anesthetic was infiltrated at all port sites. Skin incisions were closed with 4-0 Monocril subcuticular sutures and Dermabond was applied.  Instrument, sponge, and needle counts were correct at the conclusion of the case.  The patient was awakened from anesthesia and brought to the recovery room in stable condition.  The patient tolerated the procedure well.   Darnell Level, MD [facility]  Office: [phone]

## 2020-09-18 ENCOUNTER — Encounter (HOSPITAL_COMMUNITY): Payer: Self-pay | Admitting: Surgery

## 2020-09-18 LAB — CBC
HCT: 43.9 % (ref 39.0–52.0)
Hemoglobin: 15.5 g/dL (ref 13.0–17.0)
MCH: 31.7 pg (ref 26.0–34.0)
MCHC: 35.3 g/dL (ref 30.0–36.0)
MCV: 89.8 fL (ref 80.0–100.0)
Platelets: 194 10*3/uL (ref 150–400)
RBC: 4.89 MIL/uL (ref 4.22–5.81)
RDW: 11.7 % (ref 11.5–15.5)
WBC: 12.7 10*3/uL — ABNORMAL HIGH (ref 4.0–10.5)
nRBC: 0 % (ref 0.0–0.2)

## 2020-09-18 LAB — COMPREHENSIVE METABOLIC PANEL
ALT: 76 U/L — ABNORMAL HIGH (ref 0–44)
AST: 65 U/L — ABNORMAL HIGH (ref 15–41)
Albumin: 3.2 g/dL — ABNORMAL LOW (ref 3.5–5.0)
Alkaline Phosphatase: 66 U/L (ref 38–126)
Anion gap: 10 (ref 5–15)
BUN: 12 mg/dL (ref 6–20)
CO2: 26 mmol/L (ref 22–32)
Calcium: 8.9 mg/dL (ref 8.9–10.3)
Chloride: 99 mmol/L (ref 98–111)
Creatinine, Ser: 0.81 mg/dL (ref 0.61–1.24)
GFR, Estimated: >60 mL/min (ref >60)
Glucose, Bld: 103 mg/dL — ABNORMAL HIGH (ref 70–99)
Potassium: 3.8 mmol/L (ref 3.5–5.1)
Sodium: 135 mmol/L (ref 135–145)
Total Bilirubin: 0.9 mg/dL (ref 0.3–1.2)
Total Protein: 6.2 g/dL — ABNORMAL LOW (ref 6.5–8.1)

## 2020-09-18 LAB — SURGICAL PATHOLOGY

## 2020-09-18 MED ORDER — OXYCODONE HCL 5 MG PO TABS: 5.0000 mg | ORAL_TABLET | Freq: Four times a day (QID) | ORAL | 0 refills | Status: AC | PRN

## 2020-09-18 MED ORDER — ACETAMINOPHEN 325 MG PO TABS: 650.0000 mg | ORAL_TABLET | Freq: Four times a day (QID) | ORAL | Status: AC | PRN

## 2020-09-18 MED ORDER — PANTOPRAZOLE SODIUM 40 MG PO TBEC: 40.0000 mg | DELAYED_RELEASE_TABLET | Freq: Every day | ORAL | Status: DC

## 2020-09-18 NOTE — Progress Notes (Signed)
Pt was given his AVS discharge summary and went over with him. Pt had no further questions. IV was removed with catheter intact. Pt waiting on wife to transport home. No equipment needed for home.

## 2020-09-18 NOTE — Discharge Summary (Signed)
[facility] Discharge Summary   Patient ID: Luis Simmons MRN: [id number] DOB/AGE: [birth year] 50 y.o.  Admit date: 09/17/2020 Discharge date: 09/18/2020  Admitting Diagnosis: Acute cholecystitis   Discharge Diagnosis S/P laparoscopic cholecystectomy   Consultants None   Imaging: US Abdomen Limited RUQ (LIVER/GB)  Result Date: 09/17/2020 CLINICAL DATA:  Right upper quadrant pain. EXAM: ULTRASOUND ABDOMEN LIMITED RIGHT UPPER QUADRANT COMPARISON:  No prior. FINDINGS: Gallbladder: Gallstones and sludge noted. Gallbladder wall thickening to 6.2 mm. Small amount of pericholecystic fluid noted. Positive Murphy sign. Findings suggest cholecystitis. Common bile duct: Diameter: 4 8 mm Liver: Echogenicity normal. 3.0 x 2.2 x 4.1 cm heterogeneous mass is noted in the left hepatic lobe. Further evaluation with MRI is suggested. Portal vein is patent on color Doppler imaging with normal direction of blood flow towards the liver. Other: Exam limited by bowel gas and patient tenderness. IMPRESSION: 1. Gallstones and sludge noted. Gallbladder wall is thickened to 6.2 mm. Small amount of pericholecystic fluid noted. Positive Murphy sign. Findings consistent with cholecystitis. No biliary distention. 2. 3.0 x 2.2 x 4.1 cm heterogeneous mass in the left hepatic lobe. MRI is suggested for further evaluation. Electronically Signed   By: Maisie Fus  Register   On: 09/17/2020 05:49    Procedures Dr. Darnell Level (09/17/20) - Laparoscopic Cholecystectomy   Hospital Course:  Patient is a 50 year old male who presented to [facility] with abdominal pain.  Workup showed acute cholecystitis.  Patient was admitted and underwent procedure listed above.  Tolerated procedure well and was transferred to the floor.  Diet was advanced as tolerated.  On POD#1, the patient was voiding well, tolerating diet, ambulating well, pain well controlled, vital signs stable, incisions c/d/i and felt stable for discharge home.  Patient will  follow up in our office in 3-4 weeks and knows to call with questions or concerns.  He will call to confirm appointment date/time.    Physical Exam: General:  Alert, NAD, pleasant, comfortable Abd:  Soft, ND, mild tenderness, incisions C/D/I  I or a member of my team have reviewed this patient in the Controlled Substance Database.   Allergies as of 09/18/2020   No Known Allergies     Medication List    STOP taking these medications   pantoprazole 40 MG tablet Commonly known as: PROTONIX     TAKE these medications   acetaminophen 325 MG tablet Commonly known as: TYLENOL Take 2 tablets (650 mg total) by mouth every 6 (six) hours as needed for mild pain or fever.   famotidine 20 MG tablet Commonly known as: PEPCID Take 20 mg by mouth 2 (two) times daily.   multivitamin capsule Take 1 capsule by mouth daily.   oxyCODONE 5 MG immediate release tablet Commonly known as: Oxy IR/ROXICODONE Take 1 tablet (5 mg total) by mouth every 6 (six) hours as needed for moderate pain.         Follow-up Information    Sur[facility], [facility]. Go on 10/08/2020.   Specialty: General Surgery Why: Follow up scheduled for 10:00 AM. Please arrive 30 min prior to appointment time. Bring photo ID and insurance information. Contact information: [address] [place] [address] [phone]               Signed: Juliet Rude , [facility] 09/18/2020, 9:56 AM Please see Amion for pager number during day hours 7:00am-4:30pm

## 2020-09-18 NOTE — Plan of Care (Signed)

## 2022-03-15 IMAGING — US US ABDOMEN LIMITED
1 series · 14 of 25 positions shown · non-contrast
Comparison: No prior.

CLINICAL DATA: Right upper quadrant pain.

EXAM:
ULTRASOUND ABDOMEN LIMITED RIGHT UPPER QUADRANT

[Series 1: us abdomen limited ruq (liver/gb) · 86 acquisitions, 14 frames shown]
[im 1/86]
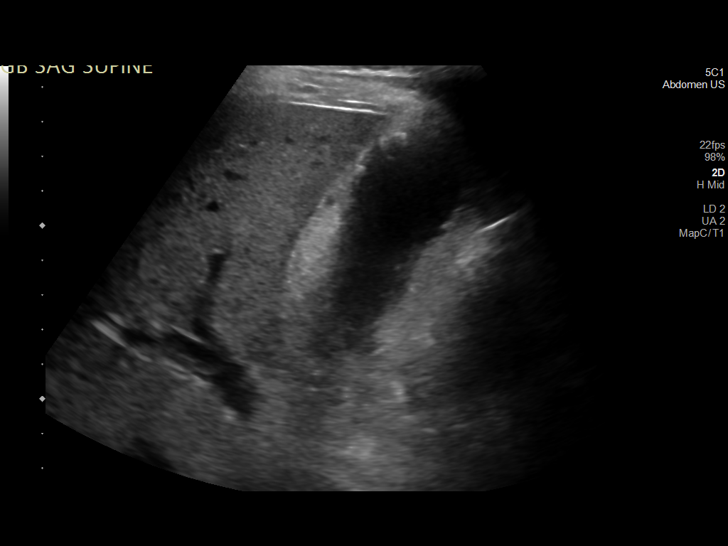
[im 8/86]
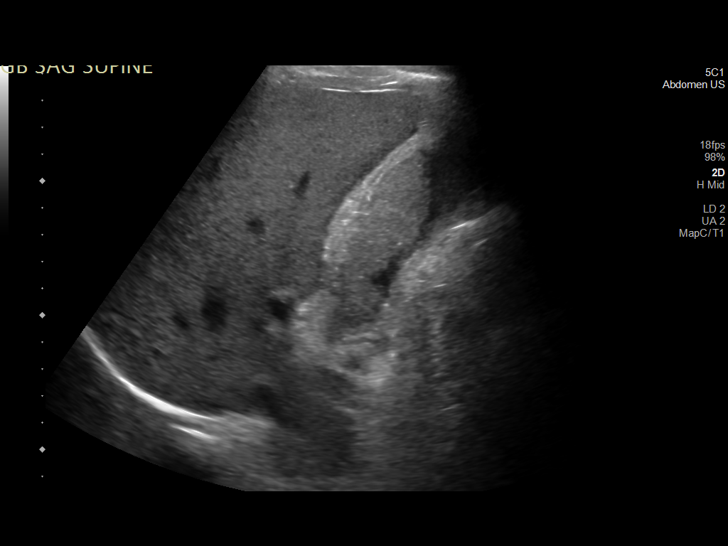
[im 15/86]
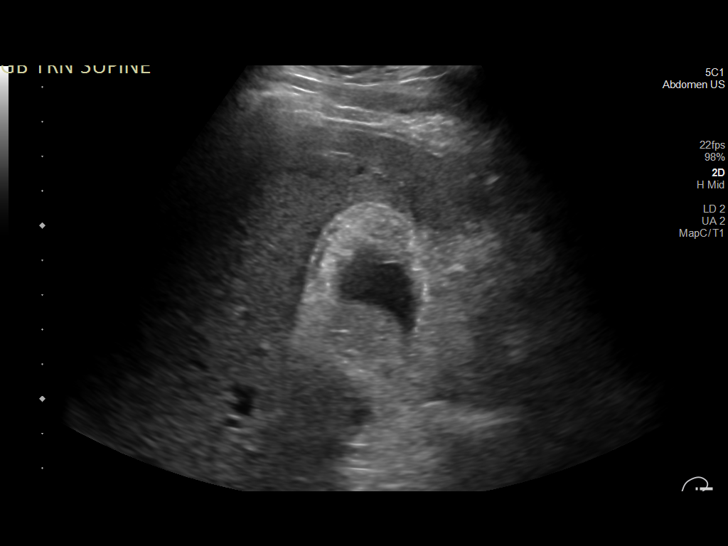
[im 22/86]
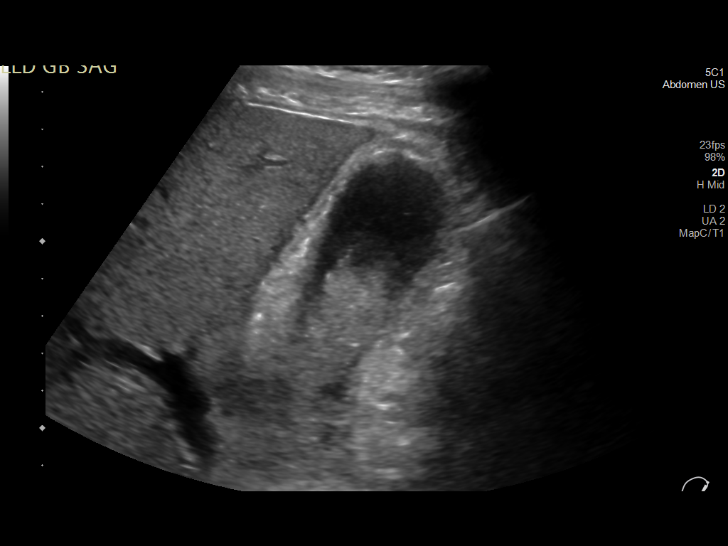
[im 29/86]
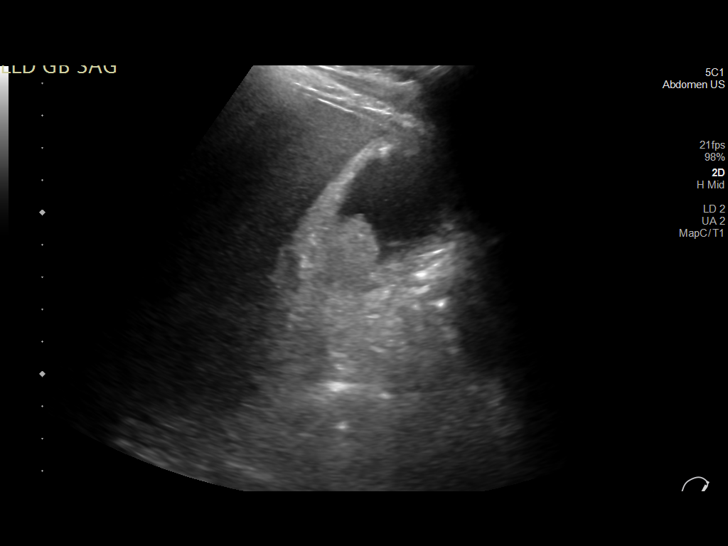
[im 32/86]
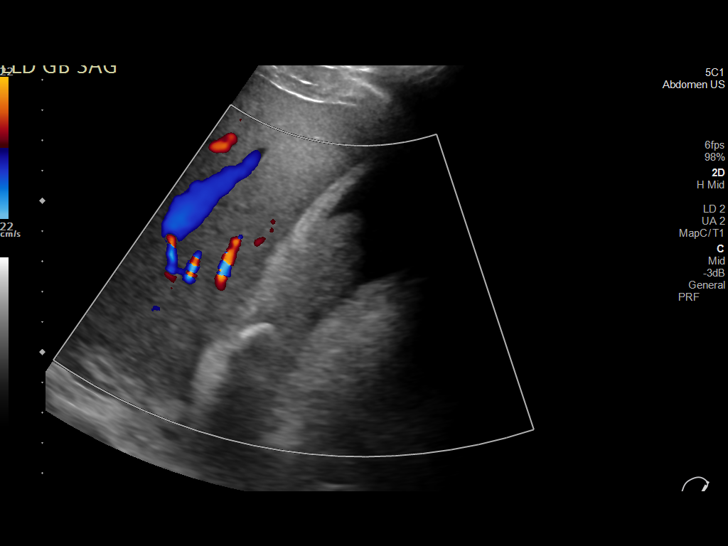
[im 39/86]
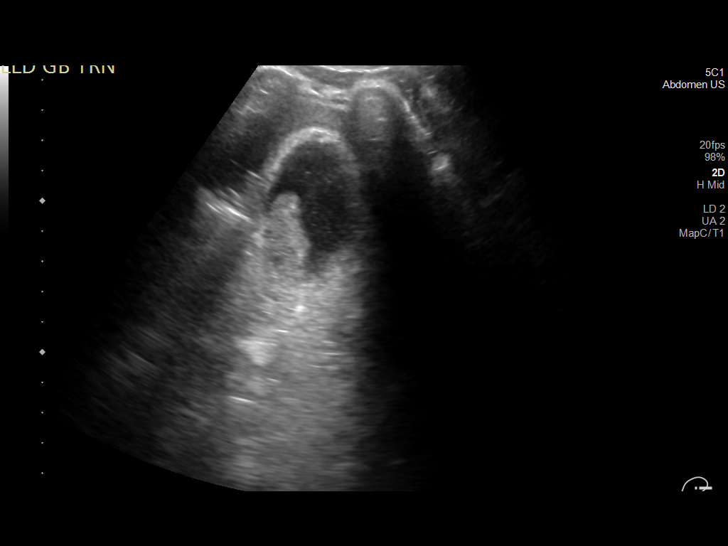
[im 47/86]
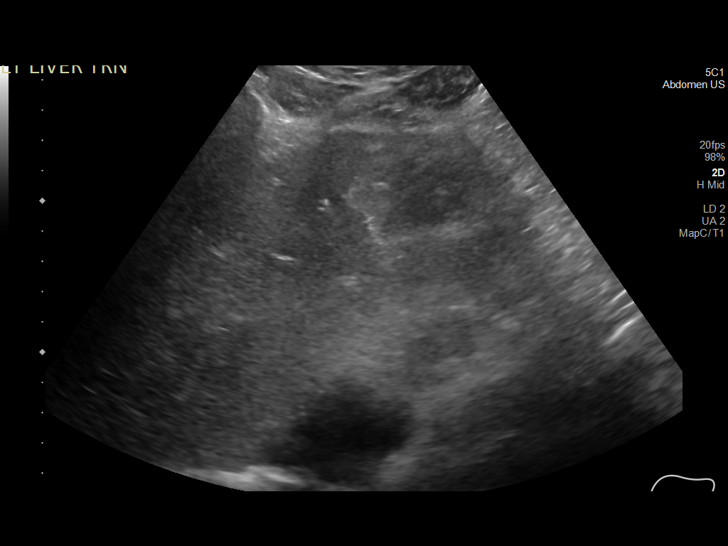
[im 54/86]
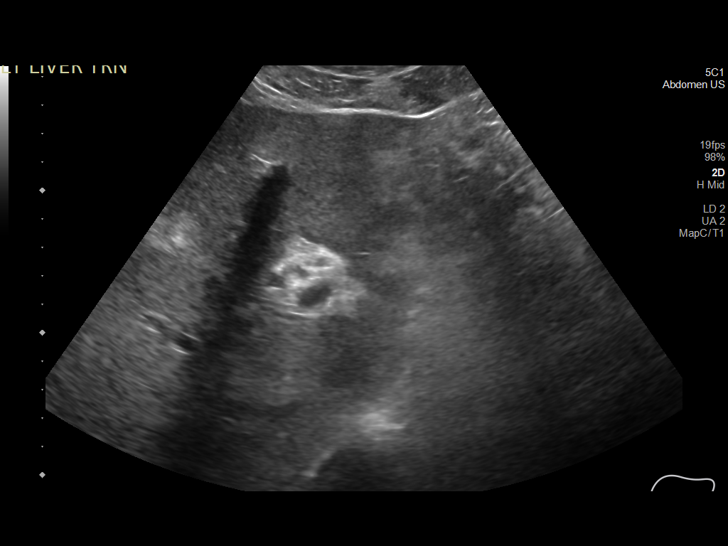
[im 57/86]
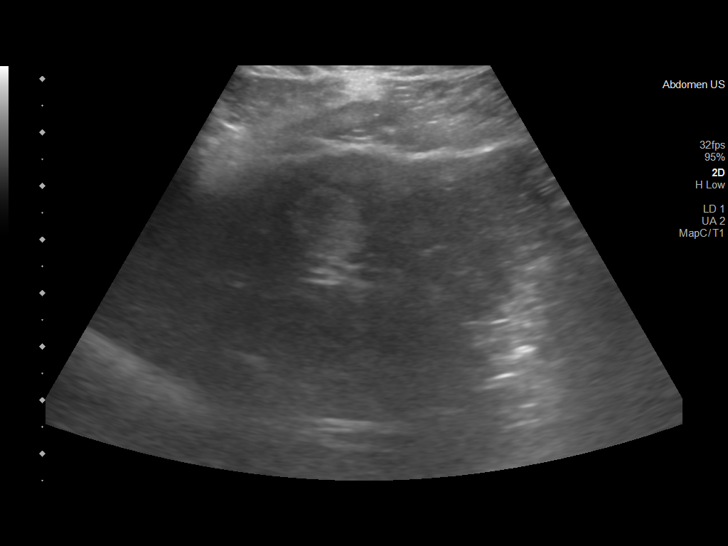
[im 64/86]
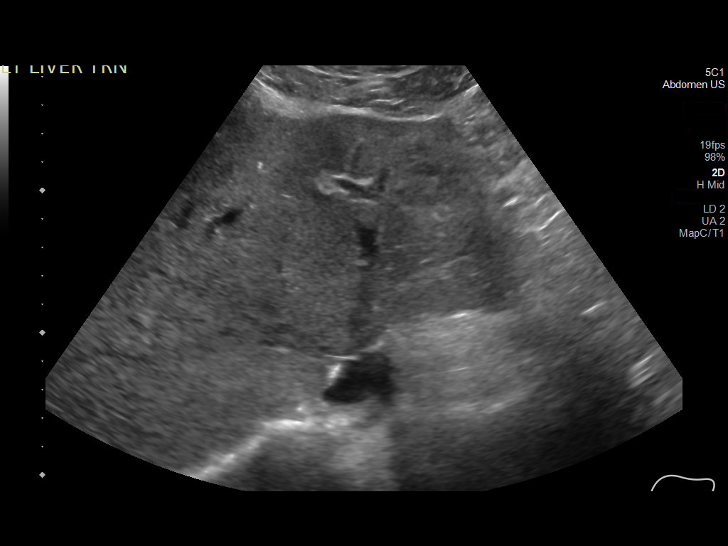
[im 71/86]
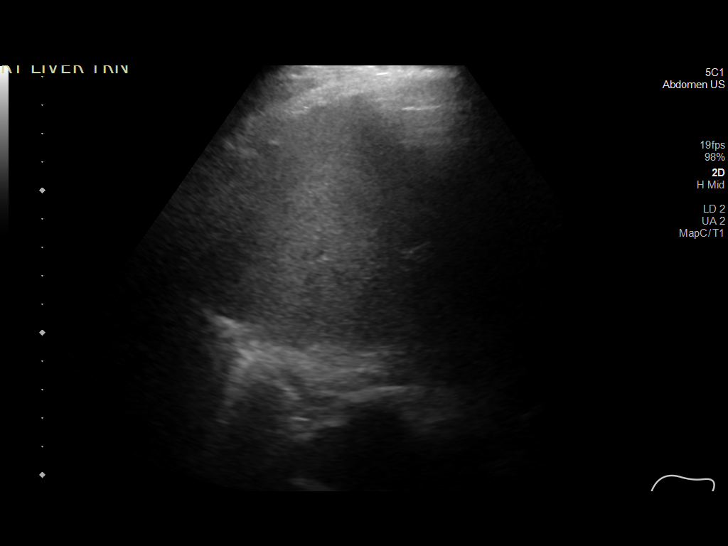
[im 78/86]
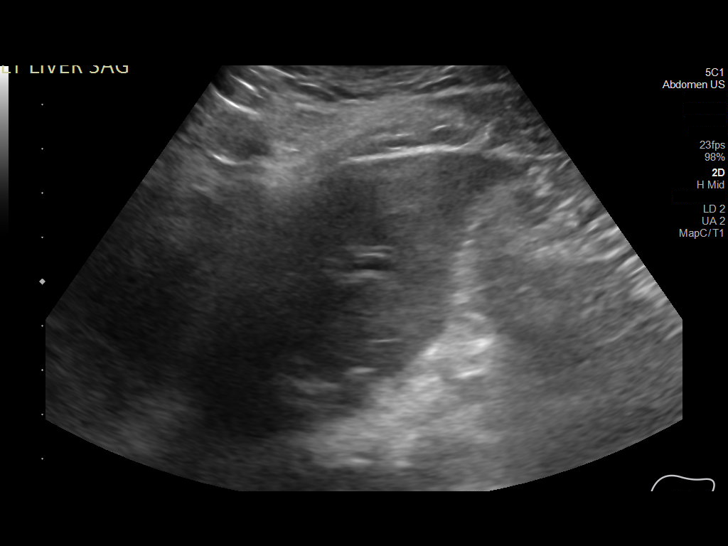
[im 86/86]
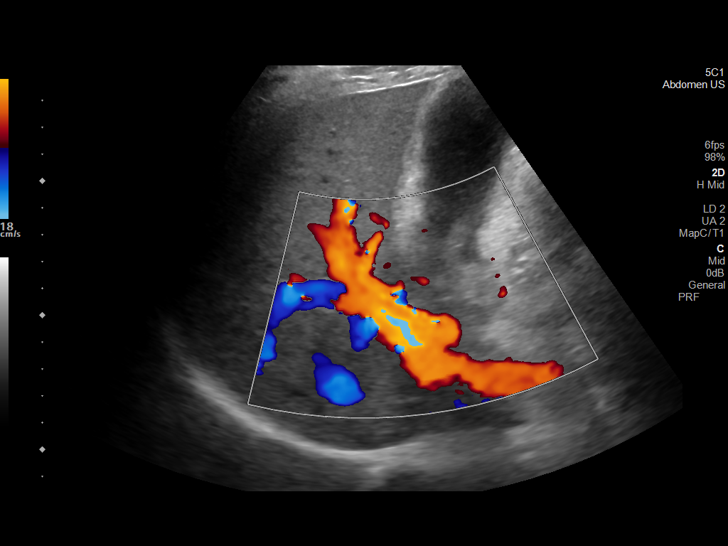

[14 of 25 positions shown; findings below may reference images not displayed]

FINDINGS: Gallbladder:

Gallstones and sludge noted. Gallbladder wall thickening to 6.2 mm.
Small amount of pericholecystic fluid noted. Positive Murphy sign.
Findings suggest cholecystitis.

Common bile duct:

Diameter: 4 8 mm

Liver:

Echogenicity normal. 3.0 x 2.2 x 4.1 cm heterogeneous mass is noted
in the left hepatic lobe. Further evaluation with MRI is suggested.
Portal vein is patent on color Doppler imaging with normal direction
of blood flow towards the liver.

Other: Exam limited by bowel gas and patient tenderness.
IMPRESSION: 1. Gallstones and sludge noted. Gallbladder wall is thickened to
mm. Small amount of pericholecystic fluid noted. Positive Murphy
sign. Findings consistent with cholecystitis. No biliary distention.

2. 3.0 x 2.2 x 4.1 cm heterogeneous mass in the left hepatic lobe.
MRI is suggested for further evaluation.
